# Patient Record
Sex: Female | Born: 1975 | Race: White | Hispanic: No | Marital: Married | Smoking: Current every day smoker
Health system: Southern US, Community
[De-identification: ages and names within clinical notes are randomized; demographics above are authoritative.]

## PROBLEM LIST (undated history)

## (undated) DIAGNOSIS — L409 Psoriasis, unspecified: Secondary | ICD-10-CM

## (undated) DIAGNOSIS — M419 Scoliosis, unspecified: Secondary | ICD-10-CM

## (undated) HISTORY — DX: Psoriasis, unspecified: L40.9

## (undated) HISTORY — PX: TUBAL LIGATION: SHX77

## (undated) HISTORY — DX: Scoliosis, unspecified: M41.9

## (undated) HISTORY — PX: SHOULDER SURGERY: SHX246

---

## 1999-03-14 ENCOUNTER — Other Ambulatory Visit: Admission: RE | Admit: 1999-03-14 | Discharge: 1999-03-14 | Payer: Self-pay | Admitting: Family Medicine

## 1999-05-09 ENCOUNTER — Encounter: Payer: Self-pay | Admitting: Family Medicine

## 1999-05-09 ENCOUNTER — Ambulatory Visit (HOSPITAL_COMMUNITY): Admission: RE | Admit: 1999-05-09 | Discharge: 1999-05-09 | Payer: Self-pay | Admitting: Family Medicine

## 1999-09-06 ENCOUNTER — Encounter: Payer: Self-pay | Admitting: Family Medicine

## 1999-09-06 ENCOUNTER — Ambulatory Visit (HOSPITAL_COMMUNITY): Admission: RE | Admit: 1999-09-06 | Discharge: 1999-09-06 | Payer: Self-pay | Admitting: Family Medicine

## 1999-09-16 ENCOUNTER — Inpatient Hospital Stay (HOSPITAL_COMMUNITY): Admission: AD | Admit: 1999-09-16 | Discharge: 1999-09-19 | Payer: Self-pay | Admitting: Family Medicine

## 2000-06-24 ENCOUNTER — Encounter: Payer: Self-pay | Admitting: Family Medicine

## 2000-06-24 ENCOUNTER — Ambulatory Visit (HOSPITAL_COMMUNITY): Admission: RE | Admit: 2000-06-24 | Discharge: 2000-06-24 | Payer: Self-pay | Admitting: Family Medicine

## 2000-08-04 ENCOUNTER — Other Ambulatory Visit: Admission: RE | Admit: 2000-08-04 | Discharge: 2000-08-04 | Payer: Self-pay | Admitting: Unknown Physician Specialty

## 2000-08-04 ENCOUNTER — Other Ambulatory Visit: Admission: RE | Admit: 2000-08-04 | Discharge: 2000-08-04 | Payer: Self-pay | Admitting: Family Medicine

## 2000-09-01 ENCOUNTER — Encounter: Payer: Self-pay | Admitting: Family Medicine

## 2000-09-01 ENCOUNTER — Ambulatory Visit (HOSPITAL_COMMUNITY): Admission: RE | Admit: 2000-09-01 | Discharge: 2000-09-01 | Payer: Self-pay | Admitting: Family Medicine

## 2000-11-19 ENCOUNTER — Encounter: Payer: Self-pay | Admitting: Family Medicine

## 2000-11-19 ENCOUNTER — Ambulatory Visit (HOSPITAL_COMMUNITY): Admission: RE | Admit: 2000-11-19 | Discharge: 2000-11-19 | Payer: Self-pay | Admitting: Family Medicine

## 2001-01-04 ENCOUNTER — Inpatient Hospital Stay (HOSPITAL_COMMUNITY): Admission: AD | Admit: 2001-01-04 | Discharge: 2001-01-04 | Payer: Self-pay | Admitting: Family Medicine

## 2001-01-14 ENCOUNTER — Ambulatory Visit (HOSPITAL_COMMUNITY): Admission: RE | Admit: 2001-01-14 | Discharge: 2001-01-14 | Payer: Self-pay | Admitting: Family Medicine

## 2001-01-14 ENCOUNTER — Encounter: Payer: Self-pay | Admitting: Family Medicine

## 2001-01-23 ENCOUNTER — Inpatient Hospital Stay (HOSPITAL_COMMUNITY): Admission: AD | Admit: 2001-01-23 | Discharge: 2001-01-27 | Payer: Self-pay | Admitting: Family Medicine

## 2001-01-23 ENCOUNTER — Encounter (INDEPENDENT_AMBULATORY_CARE_PROVIDER_SITE_OTHER): Payer: Self-pay | Admitting: Specialist

## 2001-10-21 ENCOUNTER — Other Ambulatory Visit: Admission: RE | Admit: 2001-10-21 | Discharge: 2001-10-21 | Payer: Self-pay | Admitting: Family Medicine

## 2003-03-23 ENCOUNTER — Other Ambulatory Visit: Admission: RE | Admit: 2003-03-23 | Discharge: 2003-03-23 | Payer: Self-pay | Admitting: Family Medicine

## 2004-09-13 ENCOUNTER — Emergency Department (HOSPITAL_COMMUNITY): Admission: EM | Admit: 2004-09-13 | Discharge: 2004-09-13 | Payer: Self-pay | Admitting: *Deleted

## 2005-04-22 ENCOUNTER — Ambulatory Visit: Payer: Self-pay | Admitting: Family Medicine

## 2005-08-26 ENCOUNTER — Ambulatory Visit: Payer: Self-pay | Admitting: Family Medicine

## 2005-12-01 ENCOUNTER — Ambulatory Visit: Payer: Self-pay | Admitting: Family Medicine

## 2006-09-28 ENCOUNTER — Ambulatory Visit: Payer: Self-pay | Admitting: Family Medicine

## 2007-01-04 ENCOUNTER — Encounter (INDEPENDENT_AMBULATORY_CARE_PROVIDER_SITE_OTHER): Payer: Self-pay | Admitting: General Surgery

## 2007-01-04 ENCOUNTER — Ambulatory Visit (HOSPITAL_BASED_OUTPATIENT_CLINIC_OR_DEPARTMENT_OTHER): Admission: RE | Admit: 2007-01-04 | Discharge: 2007-01-04 | Payer: Self-pay | Admitting: General Surgery

## 2010-12-10 NOTE — Op Note (Signed)
[name], Carol Nguyen                 ACCOUNT NO.:  [id number]   MEDICAL RECORD NO.:  [id number]          PATIENT TYPE:  AMB   LOCATION:  DSC                          FACILITY:  [facility]   PHYSICIAN:  Carol Nguyen, M.D.DATE OF BIRTH:  August 20, 1975   DATE OF PROCEDURE:  01/04/2007  DATE OF DISCHARGE:                               OPERATIVE REPORT   PREOPERATIVE DIAGNOSIS:  Mass left back.   POSTOPERATIVE DIAGNOSIS:  Mass left back.   PROCEDURE:  Excision mass left back.   SURGEON:  Violeta Gelinas, M.D..   ANESTHESIA:  General.   HISTORY OF PRESENT ILLNESS:  [name] is a 35 year old female who was  initially seen by Gita Kudo, M.D. at our practice for a mass on  her left back.  She declined surgery initially.  The mass has grown and  become more symptomatic.  I evaluated her in the office and she presents  for elective excision.  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   PROCEDURE IN DETAIL:  Informed consent was obtained the patient received  intravenous antibiotics.  Her site was marked.  She is brought to the  operating room.  General anesthesia with laryngeal mask airway was  administered by the anesthesia staff.  She was positioned on the right  lateral position and her back was prepped and draped in sterile fashion.  A mixture of 1% lidocaine with epinephrine and 0.25% Marcaine was  injected over the mass.  An incision was made along tissue lines.  Subcutaneous tissues were dissected down revealing this lobulated mass.  It was about 7 or 8 cm in size.  It was circumferentially dissected and  extended down to the fascia overlying her musculature.  Meticulous  hemostasis was obtained along the way with Bovie cautery.  The mass was  sent to pathology.  The wound was copiously irrigated, hemostasis was  again ensured.  Wound was then closed in layers with subcutaneous  tissues approximated with interrupted 3-0 Vicryl suture.  The skin  closed with running 4-0 Monocryl subcuticular  stitch.  Sponge, needle  and instrument counts were correct.  Benzoin, Steri-Strips and sterile  dressings were applied.  The patient tolerated procedure well without  apparent complication was taken recovery room in stable condition.      Carol Dare Janee Nguyen, M.D.  Electronically Signed     BET/MEDQ  D:  01/04/2007  T:  01/04/2007  Job:  [id number]   cc:   Dr. Dalbert Garnet, [facility]

## 2010-12-13 NOTE — Discharge Summary (Signed)
[facility]  Patient:    Carol Nguyen, Carol Nguyen Visit Number: [id number] MRN: [id number]          Service Type: OBS Location: [id number] Attending Physician:  Bosie Clos Admit Date:  01/23/2001 Discharge Date: 01/27/2001                             Discharge Summary  DATE OF BIRTH:  10-Apr-1976  ADMITTING DIAGNOSES: 1. Gravida 2, para 1 at 38-5/7 weeks, spontaneous rupture of membranes. 2. Prodromal labor. 3. Fetal macrosomia with borderline gestational diabetes.  DISCHARGE DIAGNOSES: 1. Gravida 2, para 2, term pregnancy delivered at 38-5/7 weeks. 2. Prolapsed cord. 3. Mild iron deficiency anemia.  PROCEDURES:  Primary low transverse cesarean section for prolapsed cord.  DISCHARGE INSTRUCTIONS:  Routine postop instructions were given.  Patient was to follow up on Friday, January 29, 2001, for removal of remaining staples and recheck.  DISCHARGE MEDICATIONS: 1. Tylox one to two q.6h. p.r.n. 2. Motrin 800 mg q.8h. p.r.n. 3. Ortho Tri-Cyclen to begin on Sunday, July 14th, and then IUD at six to    eight weeks postpartum. 4. Prenatal vitamin q.d.  HOSPITAL COURSE:  Twenty-three-YOA G2, P1 at 38-5/7 weeks presented with spontaneous rupture of membranes and clear fluid at approximately 2100 on January 22, 2001.  She did not experience any actual contractions but had a fair amount of low back pain.  She was started on Pitocin after cervical exam revealed a dilation of 2 cm, 50% effaced, with very high station.  Once the baby was in the pelvis, plans to complete rupture of membranes were made.  On January 24, 2001 at 7 a.m., vaginal exam revealed 6-cm cervical dilation but persistent high station.  AROM released a large quantity of fluid, babys vertex shifted to the right in the pelvis with an ear palpable end loop of cord dropped next to that ear.  It was felt that the vertex was unlikely to descend safely, so she was taken to the OR for an emergent  primary low transverse cesarean section.  At no time were fetal heart tones lost and cord pH at the time of delivery was 7.24, Apgars were 9/9 and she had a viable female with a weight of 8 pounds 10 ounces.  A boggy uterus after delivery was treated with Pitocin and methergine successfully.  Patient was kept on an additional 48 hours of IV antibiotics because of the excessive cervical and lower uterine manipulation leading up to the primary low transverse cesarean section.  Postop course was actually routine without any evidence of infection or problems.  Postop hemoglobin was 11.1.  Patient was ambulating well, stooling normally, voiding normally and eating well at the time of discharge.  She was bottle feeding, planning oral contraceptives until an IUD could be placed at six to eight weeks postpartum.  At the time of discharge, patient also developed a viral URI (had started approximately 24 hours prior to presentation to the hospital).  She was treated with Sudafed and Cepacol lozenges during her hospitalization and advised to continue that at home. She was to follow up on Friday, January 29, 2001, for removal of remaining staples. Attending Physician:  Bosie Clos DD:  01/27/01 TD:  01/29/01 Job: [id number]

## 2010-12-13 NOTE — Op Note (Signed)
[facility] [place]  Patient:    Carol Nguyen, Carol Nguyen                        MRN: [id number] Proc. Date: 02/06/01 Adm. Date:  [id number] Disc. Date: [id number] Attending:  Bosie Clos                           Operative Report  PREOPERATIVE DIAGNOSES:       1. Intrauterine pregnancy at 38-5/[redacted] weeks                                  gestation.                               2. Cord prolapse.  POSTOPERATIVE DIAGNOSES:      1. Intrauterine pregnancy at 38-5/[redacted] weeks                                  gestation.                               2. Cord prolapse.  OPERATION:                    Low transverse cesarean delivery.  SURGEON:                      Conni Elliot, M.D.  ASSISTANT:                    Montey Hora, M.D.  ANESTHESIA:                   General endotracheal.  OPERATIVE FINDINGS:           Female infant weighing 8 lb 10 oz.  Cord pH 7.24.  DESCRIPTION OF PROCEDURE:     The patient was brought emergently to the operating room, was prepped and draped in sterile fashion.  The patient was emergently placed on general anesthesia.  The abdomen was entered through a low transverse Pfannenstiel incision.  The incision was made through the skin and subcutaneous fascia.  A bladder flap was created.  A low transverse uterine incision was made.  The baby was delivered from the vertex presentation.  The cord was doubly clamped and cut and the baby handed to the neonatologist in attendance.  The placenta was delivered spontaneously.  The uterus, bladder flap, anterior peritoneum, fascia, subcutaneous tissue and skin were closed in locking fashion.  Estimated blood loss was about 700 cc. Sponge, needle and instrument counts were correct. DD:  02/24/01 TD:  02/24/01 Job: [id number]

## 2011-05-15 LAB — POCT HEMOGLOBIN-HEMACUE
Hemoglobin: 13.7
Operator id: 23949

## 2011-08-23 ENCOUNTER — Encounter (HOSPITAL_COMMUNITY): Payer: Self-pay

## 2011-08-23 ENCOUNTER — Emergency Department (HOSPITAL_COMMUNITY)
Admission: EM | Admit: 2011-08-23 | Discharge: 2011-08-23 | Disposition: A | Discharge status: Home / Self Care | Payer: Medicaid Other | Attending: Emergency Medicine | Admitting: Emergency Medicine

## 2011-08-23 DIAGNOSIS — F17200 Nicotine dependence, unspecified, uncomplicated: Secondary | ICD-10-CM | POA: Insufficient documentation

## 2011-08-23 DIAGNOSIS — J329 Chronic sinusitis, unspecified: Secondary | ICD-10-CM | POA: Insufficient documentation

## 2011-08-23 MED ORDER — LEVOFLOXACIN 500 MG PO TABS: 500.0000 mg | ORAL_TABLET | Freq: Every day | ORAL | Status: AC

## 2011-08-23 MED ORDER — HYDROCODONE-ACETAMINOPHEN 5-325 MG PO TABS: 1.0000 | ORAL_TABLET | ORAL | Status: AC | PRN

## 2011-08-23 NOTE — ED Provider Notes (Signed)
History     CSN: [id number]  Arrival date & time 08/23/11  1510   First MD Initiated Contact with Patient 08/23/11 1545      Chief Complaint  Patient presents with  . Migraine    Sinus pressure with stuffy nose    (Consider location/radiation/quality/duration/timing/severity/associated sxs/prior treatment) HPI Comments: Patient here with facial pain behind her eyes and to her cheeks - states she recently just got off amoxicillin which she was taking for an ear infection - states no relief in the pain - reports nasal congestion - fever without chills, denies cough, photophobia, neck stiffness, nausea or vomiting.  Patient is a 36 y.o. female presenting with migraine. The history is provided by the patient. No language interpreter was used.  Migraine This is a recurrent problem. The current episode started in the past 7 days. The problem occurs constantly. The problem has been unchanged. Associated symptoms include congestion and headaches. Pertinent negatives include no abdominal pain, arthralgias, chest pain, chills, coughing, diaphoresis, fatigue, fever, myalgias, nausea, neck pain, numbness, rash, sore throat, swollen glands, vertigo, visual change, vomiting or weakness. The symptoms are aggravated by nothing. She has tried nothing for the symptoms. The treatment provided no relief.    Past Medical History  Diagnosis Date  . Migraine     Past Surgical History  Procedure Date  . Cesarean section   . Tubal ligation     No family history on file.  History  Substance Use Topics  . Smoking status: Current Everyday Smoker -- 0.5 packs/day  . Smokeless tobacco: Not on file  . Alcohol Use: No    OB History    Grav Para Term Preterm Abortions TAB SAB Ect Mult Living                  Review of Systems  Constitutional: Negative for fever, chills, diaphoresis and fatigue.  HENT: Positive for congestion, sneezing and sinus pressure. Negative for sore throat and neck pain.     Respiratory: Negative for cough.   Cardiovascular: Negative for chest pain.  Gastrointestinal: Negative for nausea, vomiting and abdominal pain.  Musculoskeletal: Negative for myalgias and arthralgias.  Skin: Negative for rash.  Neurological: Positive for headaches. Negative for vertigo, weakness and numbness.  All other systems reviewed and are negative.    Allergies  Sulfa antibiotics  Home Medications  No current outpatient prescriptions on file.  BP 121/72  Pulse 88  Temp(Src) 98.3 F (36.8 C) (Oral)  Resp 20  Ht 5\' 4"  (1.626 m)  Wt 176 lb 4 oz (79.946 kg)  BMI 30.25 kg/m2  SpO2 96%  LMP 08/21/2011  Physical Exam  Nursing note and vitals reviewed. Constitutional: She is oriented to person, place, and time. She appears well-developed and well-nourished. No distress.  HENT:  Head: Normocephalic and atraumatic.  Right Ear: External ear normal.  Left Ear: External ear normal.  Nose: Mucosal edema present. Right sinus exhibits maxillary sinus tenderness and frontal sinus tenderness. Left sinus exhibits maxillary sinus tenderness and frontal sinus tenderness.  Mouth/Throat: Oropharynx is clear and moist. No oropharyngeal exudate.  Eyes: Conjunctivae are normal. Pupils are equal, round, and reactive to light. No scleral icterus.  Neck: Normal range of motion. Neck supple.  Cardiovascular: Normal rate, regular rhythm and normal heart sounds.  Exam reveals no gallop and no friction rub.   No murmur heard. Pulmonary/Chest: Effort normal and breath sounds normal. No respiratory distress. She exhibits no tenderness.  Abdominal: Soft. Bowel sounds are normal. She  exhibits no distension. There is no tenderness.  Musculoskeletal: Normal range of motion.  Lymphadenopathy:    She has no cervical adenopathy.  Neurological: She is alert and oriented to person, place, and time. No cranial nerve deficit. She exhibits normal muscle tone. Coordination normal.  Skin: Skin is warm and dry.  No rash noted. No erythema. No pallor.  Psychiatric: She has a normal mood and affect. Her behavior is normal. Judgment and thought content normal.    ED Course  Procedures (including critical care time)  Labs Reviewed - No data to display No results found.   Sinusitis   MDM  Patient here with sinusitis, though she terms this headache as "migraine", she reports no history of migraines in the past - no other classic symptoms of migraine, do not suspect ICH with this either.        Izola Price [place], [place] 08/23/11 1623

## 2011-08-23 NOTE — ED Provider Notes (Signed)
Medical screening examination/treatment/procedure(s) were performed by non-physician practitioner and as supervising physician I was immediately available for consultation/collaboration.   Andrew King, MD 08/23/11 1632 

## 2011-08-23 NOTE — ED Notes (Signed)
Pt presents with "migraine". Pt states she has had a stuffy nose and pain is in sinus area and behind right eye. Pt with Hx of Migraines. Pt states she was taking Amoxicillin approx 1 week ago for ear infection. Pt states she completed medication with no relief.

## 2011-08-23 NOTE — ED Notes (Signed)
Pt states has had headache x 1 week. Pt states has taken several OTC without relief.

## 2012-04-07 ENCOUNTER — Encounter (HOSPITAL_COMMUNITY): Payer: Self-pay

## 2012-04-07 ENCOUNTER — Emergency Department (HOSPITAL_COMMUNITY)
Admission: EM | Admit: 2012-04-07 | Discharge: 2012-04-07 | Disposition: A | Discharge status: Home / Self Care | Payer: Medicaid Other | Attending: Emergency Medicine | Admitting: Emergency Medicine

## 2012-04-07 DIAGNOSIS — J069 Acute upper respiratory infection, unspecified: Secondary | ICD-10-CM

## 2012-04-07 DIAGNOSIS — Z882 Allergy status to sulfonamides status: Secondary | ICD-10-CM | POA: Insufficient documentation

## 2012-04-07 DIAGNOSIS — J329 Chronic sinusitis, unspecified: Secondary | ICD-10-CM

## 2012-04-07 DIAGNOSIS — F17200 Nicotine dependence, unspecified, uncomplicated: Secondary | ICD-10-CM | POA: Insufficient documentation

## 2012-04-07 MED ORDER — PSEUDOEPHEDRINE HCL 60 MG PO TABS: ORAL_TABLET | ORAL | Status: DC

## 2012-04-07 MED ORDER — PREDNISONE 10 MG PO TABS: ORAL_TABLET | ORAL | Status: DC

## 2012-04-07 MED ORDER — PROMETHAZINE-DM 6.25-15 MG/5ML PO SYRP: 5.0000 mL | ORAL_SOLUTION | Freq: Four times a day (QID) | ORAL | Status: AC | PRN

## 2012-04-07 NOTE — ED Notes (Signed)
Discharge instructions reviewed with pt, questions answered. Pt verbalized understanding.  

## 2012-04-07 NOTE — ED Notes (Signed)
Pt c/o headache, cough and congestion for 3 days.

## 2012-04-07 NOTE — ED Provider Notes (Signed)
History     CSN: [id number]  Arrival date & time 04/07/12  1652   First MD Initiated Contact with Patient 04/07/12 1726      Chief Complaint  Patient presents with  . Headache  . Nasal Congestion  . Cough    (Consider location/radiation/quality/duration/timing/severity/associated sxs/prior treatment) Patient is a 36 y.o. female presenting with headaches and cough. The history is provided by the patient.  Headache  This is a new problem. The current episode started more than 2 days ago. The problem has not changed since onset.Associated with: cough and nasal congestion. The pain is located in the frontal region. The quality of the pain is described as sharp and throbbing. The pain is moderate. Pertinent negatives include no fever, no palpitations, no syncope, no shortness of breath, no nausea and no vomiting. She has tried acetaminophen for the symptoms. The treatment provided no relief.  Cough Associated symptoms include headaches. Pertinent negatives include no chest pain, no shortness of breath and no wheezing.    Past Medical History  Diagnosis Date  . Migraine     Past Surgical History  Procedure Date  . Cesarean section   . Tubal ligation     History reviewed. No pertinent family history.  History  Substance Use Topics  . Smoking status: Current Every Day Smoker -- 0.5 packs/day  . Smokeless tobacco: Not on file  . Alcohol Use: No    OB History    Grav Para Term Preterm Abortions TAB SAB Ect Mult Living                  Review of Systems  Constitutional: Negative for fever and activity change.       All ROS Neg except as noted in HPI  HENT: Positive for congestion. Negative for nosebleeds and neck pain.   Eyes: Negative for photophobia and discharge.  Respiratory: Positive for cough. Negative for shortness of breath and wheezing.   Cardiovascular: Negative for chest pain, palpitations and syncope.  Gastrointestinal: Negative for nausea, vomiting,  abdominal pain and blood in stool.  Genitourinary: Negative for dysuria, frequency and hematuria.  Musculoskeletal: Negative for back pain and arthralgias.  Skin: Negative.   Neurological: Positive for headaches. Negative for dizziness, seizures and speech difficulty.  Psychiatric/Behavioral: Negative for hallucinations and confusion.    Allergies  Sulfa antibiotics  Home Medications   Current Outpatient Rx  Name Route Sig Dispense Refill  . PREDNISONE 10 MG PO TABS  5,4,3,2,1 - take with food 15 tablet 0  . PROMETHAZINE-DM 6.25-15 MG/5ML PO SYRP Oral Take 5 mLs by mouth every 6 (six) hours as needed for cough. 120 mL 0  . PSEUDOEPHEDRINE HCL 60 MG PO TABS  1 po tid for congestion 21 tablet 0    BP 130/70  Pulse 69  Temp 97.8 F (36.6 C) (Oral)  Resp 20  Ht 5\' 5"  (1.651 m)  Wt 176 lb 6.4 oz (80.015 kg)  BMI 29.35 kg/m2  SpO2 100%  LMP 03/29/2012  Physical Exam  Nursing note and vitals reviewed. Constitutional: She is oriented to person, place, and time. She appears well-developed and well-nourished.  Non-toxic appearance.  HENT:  Head: Normocephalic.  Right Ear: Tympanic membrane and external ear normal.  Left Ear: Tympanic membrane and external ear normal.       Nasal congestion present  Eyes: EOM and lids are normal. Pupils are equal, round, and reactive to light.  Neck: Normal range of motion. Neck supple. Carotid bruit is not  present.  Cardiovascular: Normal rate, regular rhythm, normal heart sounds, intact distal pulses and normal pulses.   Pulmonary/Chest: Breath sounds normal. No respiratory distress.  Abdominal: Soft. Bowel sounds are normal. There is no tenderness. There is no guarding.  Musculoskeletal: Normal range of motion.  Lymphadenopathy:       Head (right side): No submandibular adenopathy present.       Head (left side): No submandibular adenopathy present.    She has no cervical adenopathy.  Neurological: She is alert and oriented to person, place,  and time. She has normal strength. No cranial nerve deficit or sensory deficit. She exhibits normal muscle tone. Coordination normal.  Skin: Skin is warm and dry.  Psychiatric: She has a normal mood and affect. Her speech is normal.    ED Course  Procedures (including critical care time)  Labs Reviewed - No data to display No results found.   1. Sinusitis   2. URI (upper respiratory infection)       MDM  I have reviewed nursing notes, vital signs, and all appropriate lab and imaging results for this patient. Exam is consistent with mild sinusitis and upper respiratory infection. Patient history reviewed with Sudafed 3 times daily, promethazine DM cough medication every 6 hours, and prednisone daily. Patient advised to wash hands frequently and increase fluids.       Kathie Dike, [place] 04/07/12 1749

## 2012-04-08 NOTE — ED Provider Notes (Signed)
Medical screening examination/treatment/procedure(s) were performed by non-physician practitioner and as supervising physician I was immediately available for consultation/collaboration.   Dione Booze, MD 04/08/12 [phone]

## 2012-07-21 ENCOUNTER — Encounter (HOSPITAL_COMMUNITY): Payer: Self-pay | Admitting: Emergency Medicine

## 2012-07-21 ENCOUNTER — Emergency Department (HOSPITAL_COMMUNITY)
Admission: EM | Admit: 2012-07-21 | Discharge: 2012-07-21 | Disposition: A | Discharge status: Home / Self Care | Payer: Medicaid Other | Attending: Emergency Medicine | Admitting: Emergency Medicine

## 2012-07-21 DIAGNOSIS — H6693 Otitis media, unspecified, bilateral: Secondary | ICD-10-CM

## 2012-07-21 DIAGNOSIS — F17200 Nicotine dependence, unspecified, uncomplicated: Secondary | ICD-10-CM | POA: Insufficient documentation

## 2012-07-21 DIAGNOSIS — Z8679 Personal history of other diseases of the circulatory system: Secondary | ICD-10-CM | POA: Insufficient documentation

## 2012-07-21 DIAGNOSIS — H6690 Otitis media, unspecified, unspecified ear: Secondary | ICD-10-CM | POA: Insufficient documentation

## 2012-07-21 MED ORDER — HYDROCODONE-ACETAMINOPHEN 5-325 MG PO TABS
1.0000 | ORAL_TABLET | Freq: Once | ORAL | Status: AC
  Administered 2012-07-21: 1 via ORAL
  Filled 2012-07-21: qty 1

## 2012-07-21 MED ORDER — HYDROCODONE-ACETAMINOPHEN 5-325 MG PO TABS: ORAL_TABLET | ORAL | Status: DC

## 2012-07-21 MED ORDER — ANTIPYRINE-BENZOCAINE 5.4-1.4 % OT SOLN
3.0000 [drp] | Freq: Once | OTIC | Status: AC
  Administered 2012-07-21: 3 [drp] via OTIC
  Filled 2012-07-21: qty 10

## 2012-07-21 MED ORDER — AMOXICILLIN 500 MG PO CAPS: 500.0000 mg | ORAL_CAPSULE | Freq: Three times a day (TID) | ORAL | Status: DC

## 2012-07-21 MED ORDER — AMOXICILLIN 250 MG PO CAPS
500.0000 mg | ORAL_CAPSULE | Freq: Once | ORAL | Status: AC
  Administered 2012-07-21: 500 mg via ORAL
  Filled 2012-07-21: qty 2

## 2012-07-21 NOTE — ED Provider Notes (Signed)
History     CSN: [id number]  Arrival date & time 07/21/12  2148   First MD Initiated Contact with Patient 07/21/12 2214      Chief Complaint  Patient presents with  . Otalgia    (Consider location/radiation/quality/duration/timing/severity/associated sxs/prior treatment) Patient is a 36 y.o. female presenting with ear pain. The history is provided by the patient.  Otalgia This is a new problem. The current episode started yesterday. There is pain in the right ear. The problem occurs constantly. The problem has been gradually worsening. There has been no fever. The pain is moderate. Associated symptoms include headaches and rhinorrhea. Pertinent negatives include no ear discharge, no hearing loss, no sore throat, no abdominal pain, no vomiting, no neck pain, no cough and no rash.    Past Medical History  Diagnosis Date  . Migraine     Past Surgical History  Procedure Date  . Cesarean section   . Tubal ligation     History reviewed. No pertinent family history.  History  Substance Use Topics  . Smoking status: Current Every Day Smoker -- 0.5 packs/day  . Smokeless tobacco: Not on file  . Alcohol Use: No    OB History    Grav Para Term Preterm Abortions TAB SAB Ect Mult Living                  Review of Systems  Constitutional: Negative for fever, activity change and appetite change.  HENT: Positive for ear pain, congestion, rhinorrhea and sinus pressure. Negative for hearing loss, nosebleeds, sore throat, facial swelling, trouble swallowing, neck pain, tinnitus and ear discharge.   Respiratory: Negative for cough and chest tightness.   Gastrointestinal: Negative for vomiting and abdominal pain.  Genitourinary: Negative for dysuria and flank pain.  Skin: Negative for rash.  Neurological: Positive for headaches. Negative for dizziness, weakness and numbness.  Psychiatric/Behavioral: Negative for confusion and decreased concentration.  All other systems reviewed and  are negative.    Allergies  Sulfa antibiotics  Home Medications   Current Outpatient Rx  Name  Route  Sig  Dispense  Refill  . PHENYLEPHRINE-PHENIRAMINE-DM 05-17-19 MG PO PACK   Oral   Take 1 Package by mouth as needed.           BP 121/77  Pulse 72  Temp 97.8 F (36.6 C) (Oral)  Resp 16  Ht 5\' 4"  (1.626 m)  Wt 160 lb (72.576 kg)  BMI 27.46 kg/m2  SpO2 100%  LMP 07/20/2012  Physical Exam  Nursing note and vitals reviewed. Constitutional: She is oriented to person, place, and time. She appears well-developed and well-nourished. No distress.  HENT:  Head: Normocephalic and atraumatic.  Right Ear: No swelling. No mastoid tenderness. Tympanic membrane is erythematous and bulging. No hemotympanum.  Left Ear: No swelling. No mastoid tenderness. Tympanic membrane is erythematous. Tympanic membrane is not bulging. No hemotympanum.  Nose: Rhinorrhea present.  Mouth/Throat: Uvula is midline, oropharynx is clear and moist and mucous membranes are normal.       Erythema of the bilateral TMs. Bulging of the right TM is present. Canals appear normal.  Eyes: Conjunctivae normal and EOM are normal. Pupils are equal, round, and reactive to light.  Neck: Normal range of motion. Neck supple.  Cardiovascular: Normal rate, regular rhythm, normal heart sounds and intact distal pulses.   No murmur heard. Pulmonary/Chest: Effort normal and breath sounds normal. No respiratory distress.  Musculoskeletal: Normal range of motion.  Lymphadenopathy:    She has  no cervical adenopathy.  Neurological: She is alert and oriented to person, place, and time. She exhibits normal muscle tone. Coordination normal.  Skin: Skin is warm.    ED Course  Procedures (including critical care time)  Labs Reviewed - No data to display No results found.      MDM    Vital signs are stable patient is nontoxic appearing. Bilateral otitis media is present. No focal neuro deficits, no meningeal  signs.   Pain improved after application of auralgan otic drops.  Patient agrees to close followup with her primary care physician  Prescribed: Amoxil Norco #10 Auralgan otic drops (dispensed from ED)   Sanjit Mcmichael L. [place], [place] 07/21/12 2257

## 2012-07-21 NOTE — ED Notes (Signed)
Patient complaining of right ear pain since yesterday. 

## 2012-07-23 NOTE — ED Provider Notes (Signed)
Medical screening examination/treatment/procedure(s) were performed by non-physician practitioner and as supervising physician I was immediately available for consultation/collaboration.  Lorenia Hoston, MD 07/23/12 2353 

## 2019-10-18 ENCOUNTER — Telehealth: Payer: Self-pay

## 2019-11-02 ENCOUNTER — Encounter: Payer: Self-pay | Admitting: Physician Assistant

## 2019-11-02 ENCOUNTER — Other Ambulatory Visit (HOSPITAL_COMMUNITY)
Admission: RE | Admit: 2019-11-02 | Discharge: 2019-11-02 | Disposition: A | Discharge status: Home / Self Care | Payer: Self-pay | Source: Ambulatory Visit | Attending: Physician Assistant | Admitting: Physician Assistant

## 2019-11-02 ENCOUNTER — Ambulatory Visit: Payer: Self-pay | Admitting: Physician Assistant

## 2019-11-02 VITALS — BP 126/76 | Temp 96.3°F | Ht 65.0 in | Wt 202.6 lb

## 2019-11-02 DIAGNOSIS — Z131 Encounter for screening for diabetes mellitus: Secondary | ICD-10-CM

## 2019-11-02 DIAGNOSIS — Z13220 Encounter for screening for lipoid disorders: Secondary | ICD-10-CM

## 2019-11-02 DIAGNOSIS — L853 Xerosis cutis: Secondary | ICD-10-CM

## 2019-11-02 DIAGNOSIS — Z7689 Persons encountering health services in other specified circumstances: Secondary | ICD-10-CM

## 2019-11-02 DIAGNOSIS — Z1239 Encounter for other screening for malignant neoplasm of breast: Secondary | ICD-10-CM

## 2019-11-02 DIAGNOSIS — Z8632 Personal history of gestational diabetes: Secondary | ICD-10-CM

## 2019-11-02 DIAGNOSIS — F17200 Nicotine dependence, unspecified, uncomplicated: Secondary | ICD-10-CM

## 2019-11-02 LAB — LIPID PANEL
Cholesterol: 176 mg/dL (ref 0–200)
HDL: 43 mg/dL (ref >40)
LDL Cholesterol: 122 mg/dL — ABNORMAL HIGH (ref 0–99)
Total CHOL/HDL Ratio: 4.1 RATIO
Triglycerides: 57 mg/dL (ref <150)
VLDL: 11 mg/dL (ref 0–40)

## 2019-11-02 LAB — COMPREHENSIVE METABOLIC PANEL
ALT: 16 U/L (ref 0–44)
AST: 14 U/L — ABNORMAL LOW (ref 15–41)
Albumin: 4.3 g/dL (ref 3.5–5.0)
Alkaline Phosphatase: 81 U/L (ref 38–126)
Anion gap: 7 (ref 5–15)
BUN: 11 mg/dL (ref 6–20)
CO2: 27 mmol/L (ref 22–32)
Calcium: 9.1 mg/dL (ref 8.9–10.3)
Chloride: 105 mmol/L (ref 98–111)
Creatinine, Ser: 0.62 mg/dL (ref 0.44–1.00)
GFR calc Af Amer: >60 mL/min (ref >60)
GFR calc non Af Amer: >60 mL/min (ref >60)
Glucose, Bld: 104 mg/dL — ABNORMAL HIGH (ref 70–99)
Potassium: 3.7 mmol/L (ref 3.5–5.1)
Sodium: 139 mmol/L (ref 135–145)
Total Bilirubin: 0.5 mg/dL (ref 0.3–1.2)
Total Protein: 6.8 g/dL (ref 6.5–8.1)

## 2019-11-02 MED ORDER — TRIAMCINOLONE ACETONIDE 0.1 % EX OINT: 1.0000 "application " | TOPICAL_OINTMENT | Freq: Two times a day (BID) | CUTANEOUS | 1 refills | Status: DC | PRN

## 2019-11-02 NOTE — Progress Notes (Signed)
BP 126/76   Temp (!) 96.3 F (35.7 C)   Ht 5\' 5"  (1.651 m)   Wt 202 lb 9.6 oz (91.9 kg)   BMI 33.71 kg/m    Subjective:    Patient ID: Carol Nguyen, female    DOB: [birth year], 44 y.o.   MRN: [id number]  HPI: Carol Nguyen is a 44 y.o. female presenting on 11/02/2019 for No chief complaint on file.   HPI   Pt had a negative covid 19 screening questionnaire.    Pt is 61yoF who presents to office today to establish care.  She says she was previously a pt at [facility] but says they only treated her psoriasis.  Pt recently moved to [facility].  No O2 sat done today due to nail polish  Pt works at a [employer]-  She Does all jobs at [employer].    She says her Only condition is psoriasis  Pt never had mammogram.  She says her Mother had breast cancer  Pt thinks she has hasn't had a PAP in probably 11 years ago.    Pt thinks her only labs were to see what kind of psoriasis she had.  Pt only complaint is psoriasis.  She says it is Worse lately.  Pt thinks due to stress.  moslty on the legs .      Relevant past medical, surgical, family and social history reviewed and updated as indicated. Interim medical history since our last visit reviewed. Allergies and medications reviewed and updated.   Current Outpatient Medications:  .  triamcinolone ointment (KENALOG) 0.1 %, Apply 1 application topically 2 (two) times daily., Disp: , Rfl:    Review of Systems  Per HPI unless specifically indicated above     Objective:    BP 126/76   Temp (!) 96.3 F (35.7 C)   Ht 5\' 5"  (1.651 m)   Wt 202 lb 9.6 oz (91.9 kg)   BMI 33.71 kg/m   Wt Readings from Last 3 Encounters:  11/02/19 202 lb 9.6 oz (91.9 kg)  07/21/12 160 lb (72.6 kg)  04/07/12 176 lb 6.4 oz (80 kg)    Physical Exam Vitals reviewed.  Constitutional:      General: She is not in acute distress.    Appearance: Normal appearance. She is well-developed. She is not  ill-appearing.  HENT:     Head: Normocephalic and atraumatic.     Mouth/Throat:     Pharynx: No oropharyngeal exudate.  Eyes:     Conjunctiva/sclera: Conjunctivae normal.     Pupils: Pupils are equal, round, and reactive to light.  Neck:     Thyroid: No thyromegaly.  Cardiovascular:     Rate and Rhythm: Normal rate and regular rhythm.  Pulmonary:     Effort: Pulmonary effort is normal.     Breath sounds: Normal breath sounds.  Abdominal:     General: Bowel sounds are normal.     Palpations: Abdomen is soft. There is no mass.     Tenderness: There is no abdominal tenderness.  Musculoskeletal:     Cervical back: Neck supple.     Right lower leg: No edema.     Left lower leg: No edema.  Lymphadenopathy:     Cervical: No cervical adenopathy.  Skin:    General: Skin is warm and dry.          Comments: No plaques seen.  On place on L thigh about 1 inch in  diameter shows dry skin with no discoloration.  Neurological:     Mental Status: She is alert and oriented to person, place, and time.     Gait: Gait normal.  Psychiatric:        Attention and Perception: Attention normal.        Mood and Affect: Mood normal.        Speech: Speech normal.        Behavior: Behavior normal. Behavior is cooperative.         Assessment & Plan:   Encounter Diagnoses  Name Primary?  . Encounter to establish care Yes  . Dry skin   . Tobacco use disorder   . Screening cholesterol level   . Screening for diabetes mellitus   . History of gestational diabetes   . Encounter for screening for malignant neoplasm of breast, unspecified screening modality      -discussed with pt that skin condition is controlled and she doesn't need anything stronger than the TAC she is currently using.  She is given refill.  She is told that she can call for appointment if she has a flare-up.  Discussed with pt not sure it is psoriasis.  Request sent for labs done 'to determine what kind of psoriasis she has'  sent to [facility] -will refer for Screening mammogram -will get Baseline labs.  She will be called with results -pt already has appt to get covid vaccination -pt will follow up in 6 months and will plan to update PAP at that time.  She is to contact office sooner prn

## 2019-11-03 LAB — HEMOGLOBIN A1C
Hgb A1c MFr Bld: 5.3 % (ref 4.8–5.6)
Mean Plasma Glucose: 105 mg/dL

## 2019-12-01 ENCOUNTER — Other Ambulatory Visit: Payer: Self-pay | Admitting: Student

## 2019-12-01 DIAGNOSIS — Z1231 Encounter for screening mammogram for malignant neoplasm of breast: Secondary | ICD-10-CM

## 2019-12-08 ENCOUNTER — Ambulatory Visit (HOSPITAL_COMMUNITY): Payer: Self-pay

## 2019-12-21 ENCOUNTER — Ambulatory Visit (HOSPITAL_COMMUNITY): Payer: Self-pay

## 2019-12-29 ENCOUNTER — Encounter: Payer: Self-pay | Admitting: Physician Assistant

## 2020-02-06 ENCOUNTER — Ambulatory Visit (HOSPITAL_COMMUNITY)
Admission: RE | Admit: 2020-02-06 | Discharge: 2020-02-06 | Disposition: A | Discharge status: Home / Self Care | Payer: Self-pay | Source: Ambulatory Visit | Attending: Physician Assistant | Admitting: Physician Assistant

## 2020-02-06 ENCOUNTER — Encounter: Payer: Self-pay | Admitting: Physician Assistant

## 2020-02-06 ENCOUNTER — Other Ambulatory Visit: Payer: Self-pay

## 2020-02-06 DIAGNOSIS — Z1231 Encounter for screening mammogram for malignant neoplasm of breast: Secondary | ICD-10-CM | POA: Insufficient documentation

## 2020-02-06 NOTE — Progress Notes (Signed)
Pt called 02-06-20 stating she went to get her screening mammogram done but was unable to do it due to mentioning a lump on R breast. Pt was advised to contact [facility] to have dx mammo arranged. Pt states she noticed lump on R breast about one month ago. Pt reports no nipple drainage or swelling.   PA agrees to have pt arranged for dx mammo.

## 2020-02-09 ENCOUNTER — Other Ambulatory Visit (HOSPITAL_COMMUNITY): Payer: Self-pay | Admitting: Physician Assistant

## 2020-02-09 DIAGNOSIS — N63 Unspecified lump in unspecified breast: Secondary | ICD-10-CM

## 2020-02-28 ENCOUNTER — Other Ambulatory Visit (HOSPITAL_COMMUNITY): Payer: Self-pay

## 2020-02-28 ENCOUNTER — Ambulatory Visit (HOSPITAL_COMMUNITY)
Admission: RE | Admit: 2020-02-28 | Discharge: 2020-02-28 | Disposition: A | Discharge status: Home / Self Care | Payer: Self-pay | Source: Ambulatory Visit | Attending: Physician Assistant | Admitting: Physician Assistant

## 2020-02-28 ENCOUNTER — Other Ambulatory Visit: Payer: Self-pay

## 2020-02-28 DIAGNOSIS — N63 Unspecified lump in unspecified breast: Secondary | ICD-10-CM | POA: Insufficient documentation

## 2020-03-06 ENCOUNTER — Other Ambulatory Visit: Payer: Self-pay | Admitting: *Deleted

## 2020-03-06 DIAGNOSIS — R928 Other abnormal and inconclusive findings on diagnostic imaging of breast: Secondary | ICD-10-CM

## 2020-03-13 ENCOUNTER — Ambulatory Visit
Admission: RE | Admit: 2020-03-13 | Discharge: 2020-03-13 | Disposition: A | Discharge status: Home / Self Care | Payer: No Typology Code available for payment source | Source: Ambulatory Visit | Attending: *Deleted | Admitting: *Deleted

## 2020-03-13 ENCOUNTER — Other Ambulatory Visit: Payer: Self-pay

## 2020-03-13 DIAGNOSIS — R928 Other abnormal and inconclusive findings on diagnostic imaging of breast: Secondary | ICD-10-CM

## 2020-03-13 HISTORY — PX: BREAST BIOPSY: SHX20

## 2020-04-30 ENCOUNTER — Ambulatory Visit: Payer: Self-pay | Admitting: Physician Assistant

## 2021-02-01 ENCOUNTER — Other Ambulatory Visit: Payer: Self-pay | Admitting: Physician Assistant

## 2021-02-01 DIAGNOSIS — Z1231 Encounter for screening mammogram for malignant neoplasm of breast: Secondary | ICD-10-CM

## 2021-10-17 IMAGING — MG DIGITAL DIAGNOSTIC BILAT W/ TOMO W/ CAD
8 of 17 series · 8 of 40 positions shown · non-contrast
Comparison: None.

CLINICAL DATA: 44-year-old female with a palpable area of concern
in the right breast. Patient has a strong family history of breast
cancer with her mother diagnosed with breast cancer in her 60s post
mastectomy.

EXAM:
DIGITAL DIAGNOSTIC BILATERAL MAMMOGRAM WITH TOMO AND CAD; ULTRASOUND
RIGHT BREAST LIMITED

[L CC]
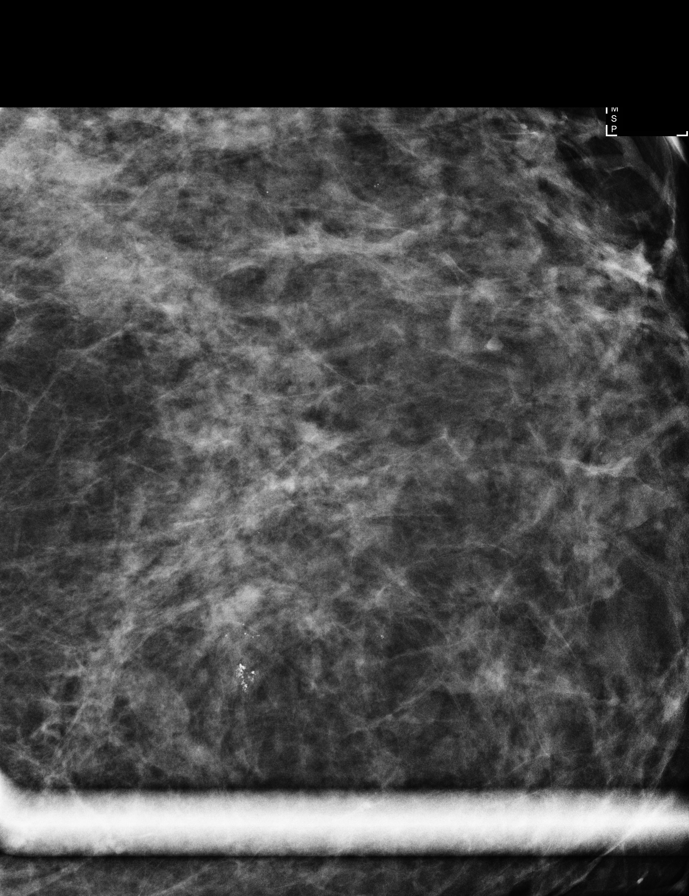

[L ML]
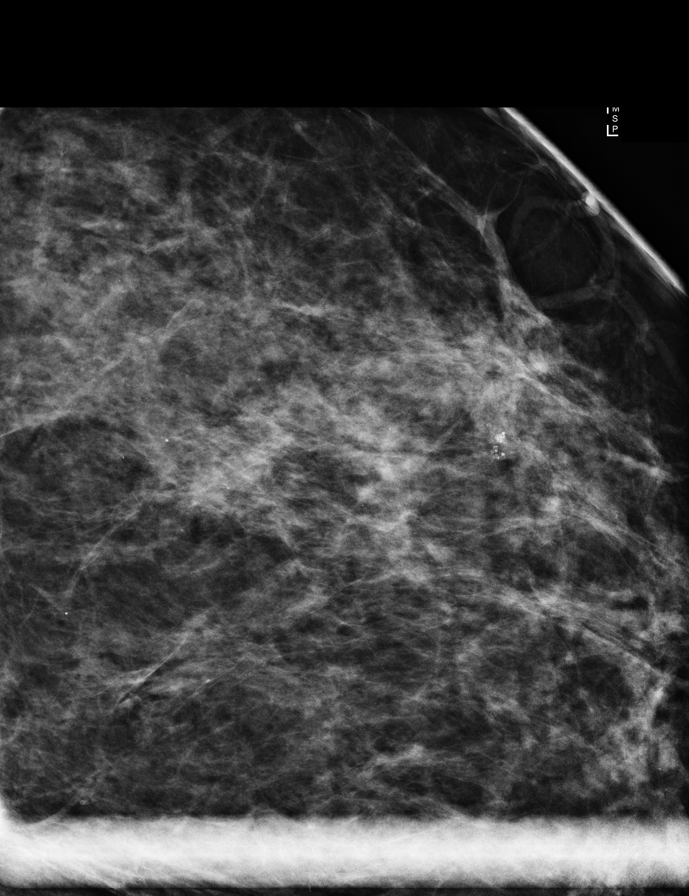

[R CC synth-2D (1 of 2)]
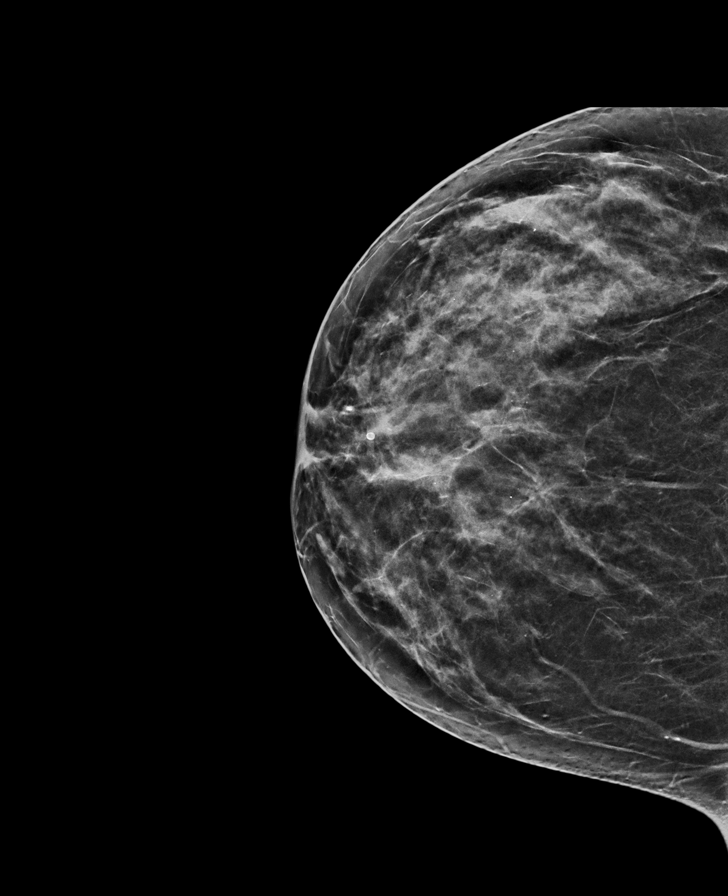

[L ML synth-2D]
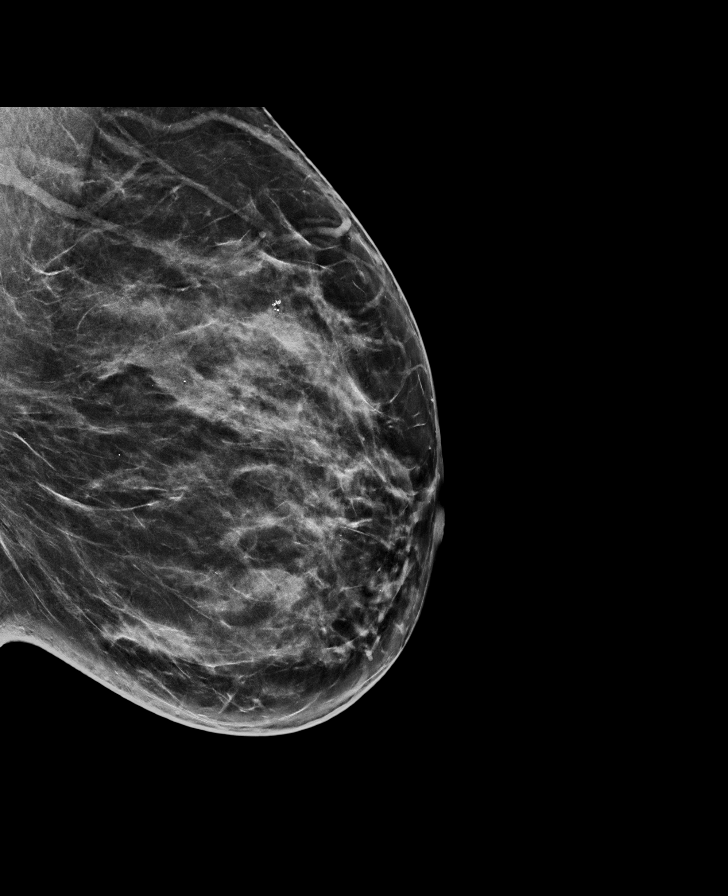

[L MLO synth-2D]
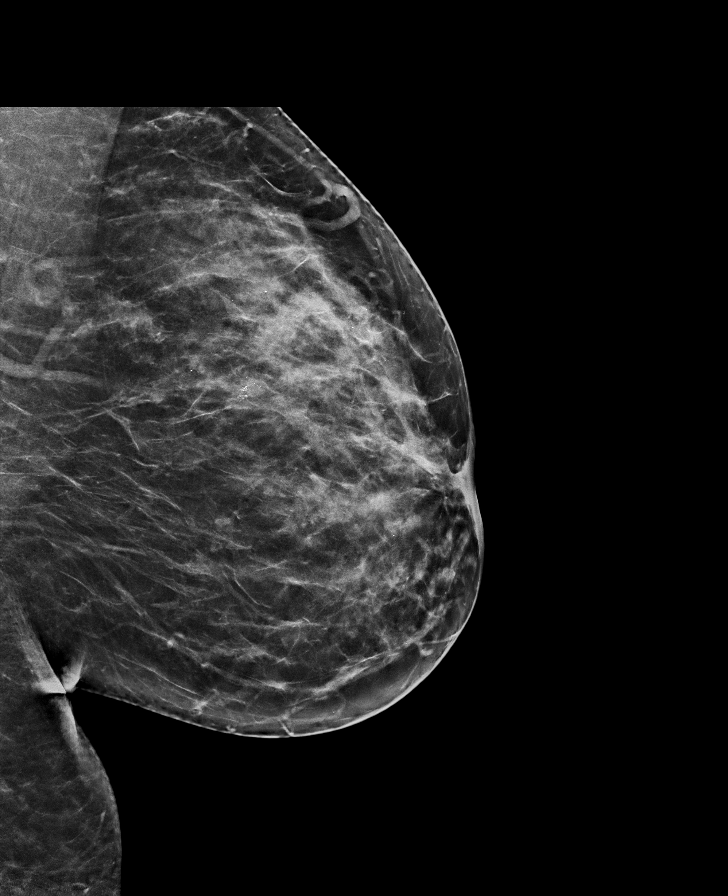

[R MLO synth-2D]
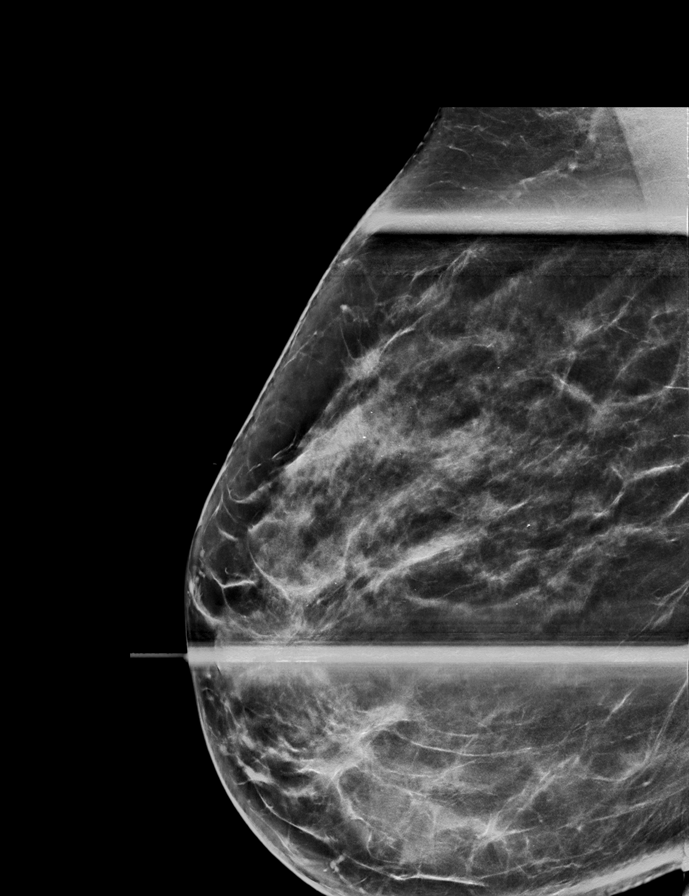

[R CC synth-2D (2 of 2)]
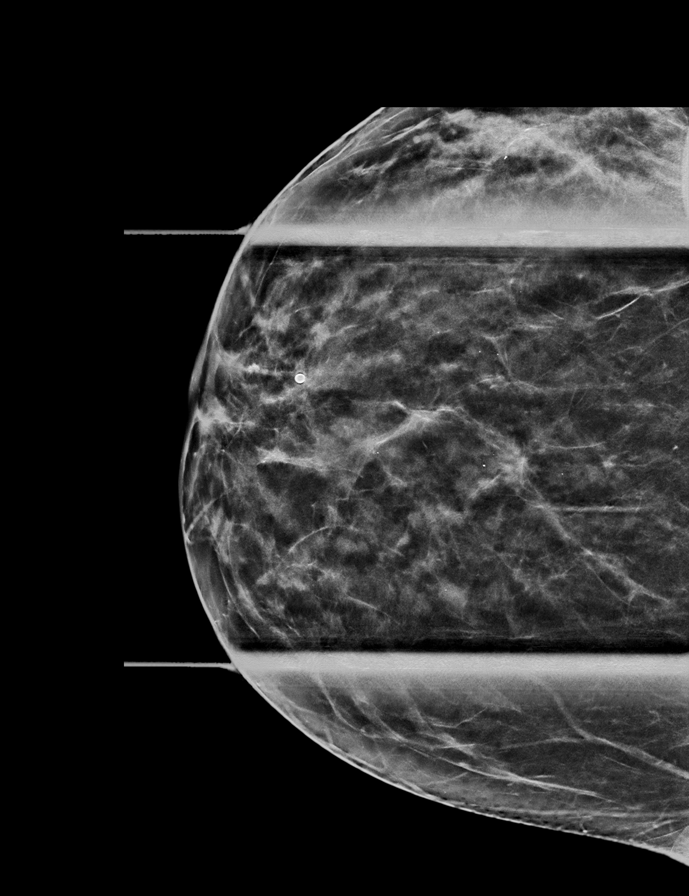

[L CC synth-2D]
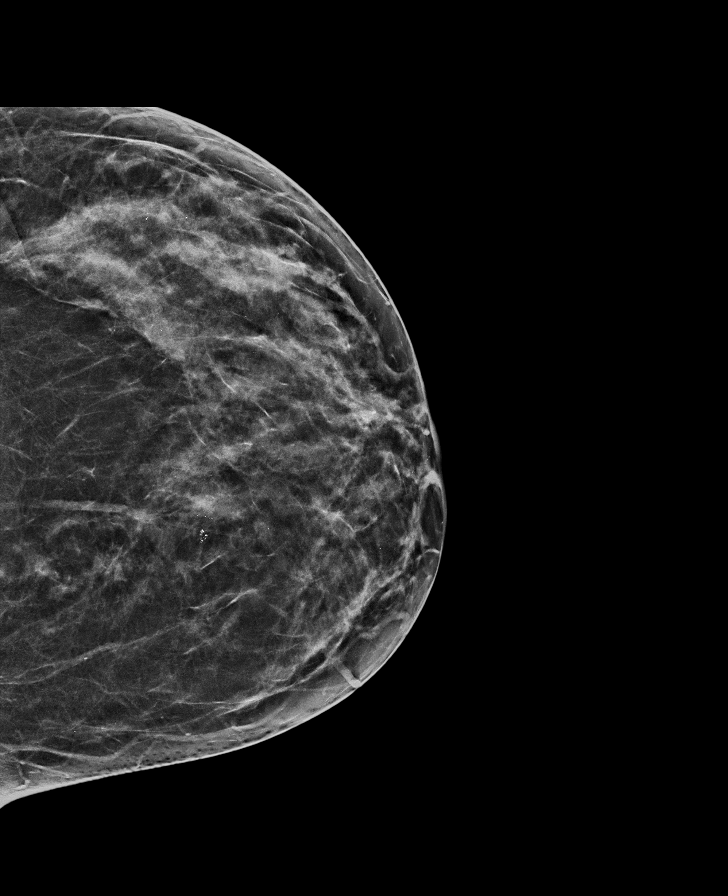

[8 of 40 positions shown; findings below may reference images not displayed]

ACR Breast Density Category c: The breast tissue is heterogeneously
dense, which may obscure small masses.
FINDINGS: No suspicious masses or calcifications are seen in the right breast.
An initially questioned asymmetry seen in the inner right breast
resolves on the additional spot compression CC tomograms compatible
with an area of overlapping fibroglandular tissue. Spot compression
MLO tomograms were performed over the palpable area of concern in
the right breast with no definite abnormalities identified.

There are coarse heterogeneous calcifications within the upper
slightly inner left breast with adjacent round/punctate
calcifications together measuring 1 cm. These do not clearly
demonstrate layering on the spot compression ML view.

Mammographic images were processed with CAD.

Physical examination site of in the central right breast does not
reveal any palpable masses.

Targeted ultrasound of the right breast was performed. No suspicious
masses or abnormality seen, only dense fibroglandular tissue
identified at the site of palpable concern in the right breast.
IMPRESSION: 1. No mammographic or sonographic abnormalities at the site of
palpable concern in the right breast.

2. Indeterminate 1 cm group of calcifications in the upper slightly
inner left breast.

RECOMMENDATION:
Recommend stereotactic guided biopsy of the calcifications in the
left breast.

I have discussed the findings and recommendations with the patient.
If applicable, a reminder letter will be sent to the patient
regarding the next appointment.

BI-RADS CATEGORY  4: Suspicious.

## 2021-12-11 ENCOUNTER — Encounter: Payer: Self-pay | Admitting: Obstetrics & Gynecology

## 2021-12-31 ENCOUNTER — Encounter: Payer: No Typology Code available for payment source | Admitting: Obstetrics & Gynecology

## 2022-07-23 DIAGNOSIS — J0100 Acute maxillary sinusitis, unspecified: Secondary | ICD-10-CM | POA: Diagnosis not present

## 2022-07-23 DIAGNOSIS — J029 Acute pharyngitis, unspecified: Secondary | ICD-10-CM | POA: Diagnosis not present

## 2022-07-23 DIAGNOSIS — R059 Cough, unspecified: Secondary | ICD-10-CM | POA: Diagnosis not present

## 2022-08-02 DIAGNOSIS — R509 Fever, unspecified: Secondary | ICD-10-CM | POA: Diagnosis not present

## 2022-08-02 DIAGNOSIS — J00 Acute nasopharyngitis [common cold]: Secondary | ICD-10-CM | POA: Diagnosis not present

## 2022-08-02 DIAGNOSIS — M419 Scoliosis, unspecified: Secondary | ICD-10-CM | POA: Diagnosis not present

## 2022-08-02 DIAGNOSIS — M546 Pain in thoracic spine: Secondary | ICD-10-CM | POA: Diagnosis not present

## 2022-08-02 DIAGNOSIS — R051 Acute cough: Secondary | ICD-10-CM | POA: Diagnosis not present

## 2022-08-02 DIAGNOSIS — Z5321 Procedure and treatment not carried out due to patient leaving prior to being seen by health care provider: Secondary | ICD-10-CM | POA: Diagnosis not present

## 2022-08-11 ENCOUNTER — Ambulatory Visit: Payer: Medicaid Other | Admitting: Nurse Practitioner

## 2022-08-11 ENCOUNTER — Encounter: Payer: Self-pay | Admitting: Nurse Practitioner

## 2022-08-11 VITALS — BP 117/82 | HR 77 | Temp 98.6°F | Ht 63.0 in | Wt 201.0 lb

## 2022-08-11 DIAGNOSIS — L853 Xerosis cutis: Secondary | ICD-10-CM | POA: Diagnosis not present

## 2022-08-11 MED ORDER — TRIAMCINOLONE 0.1 % CREAM:EUCERIN CREAM 1:1: 1.0000 | TOPICAL_CREAM | Freq: Two times a day (BID) | CUTANEOUS | Status: DC

## 2022-08-11 MED ORDER — TRIAMCINOLONE ACETONIDE 0.5 % EX OINT: 1.0000 | TOPICAL_OINTMENT | Freq: Two times a day (BID) | CUTANEOUS | 0 refills | Status: DC

## 2022-08-11 NOTE — Assessment & Plan Note (Signed)
Patient is establishing care today with concerns of bilateral hand rash. 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 I completed referral to dermatology as her skin is getting worse. Advised patient to keep skin moisturized and avoid scratching with nails, wearing  protective gloves can help keep her skin from direct dish soap and water. Eucerin advance repair cream and Kenalog will provide healing and some relief

## 2022-08-11 NOTE — Patient Instructions (Signed)
Eczema Eczema refers to a group of skin conditions that cause skin to become rough and inflamed. Each type of eczema has different triggers, symptoms, and treatments. Eczema of any type is usually itchy. Symptoms range from mild to severe. Eczema is not spread from person to person (is not contagious). It can appear on different parts of the body at different times. One person's eczema may look different from another person's eczema. What are the causes? The exact cause of this condition is not known. However, exposure to certain environmental factors, irritants, and allergens can make the condition worse. What are the signs or symptoms? Symptoms of this condition depend on the type of eczema you have. The types include: Contact dermatitis. There are two kinds: Irritant contact dermatitis. This happens when something irritates the skin and causes a rash. Allergic contact dermatitis. This happens when your skin comes in contact with something you are allergic to (allergens). This can include poison ivy, chemicals, or medicines that were applied to your skin. Atopic dermatitis. This is a long-term (chronic) skin disease that keeps coming back (recurring). It is the most common type of eczema. Usual symptoms are a red rash and itchy, dry, scaly skin. It usually starts showing signs in infancy and can last through adulthood. Dyshidrotic eczema. This is a form of eczema on the hands and feet. It shows up as very itchy, fluid-filled blisters. It can affect people of any age but is more common before age 40. Hand eczema. This causes very itchy areas of skin on the palms and sides of the hands and fingers. This type of eczema is common in industrial jobs where you may be exposed to different types of irritants. Lichen simplex chronicus. This type of eczema occurs when a person constantly scratches one area of the body. Repeated scratching of the area leads to thickened skin (lichenification). This condition can  accompany other types of eczema. It is more common in adults but may also be seen in children. Nummular eczema. This is a common type of eczema that most often affects the lower legs and the backs of the hands. It typically causes an itchy, red, circular, crusty lesion (plaque). Scratching may become a habit and can cause bleeding. Nummular eczema occurs most often in middle-aged or older people. Seborrheic dermatitis. This is a common skin disease that mainly affects the scalp. It may also affect other oily areas of the body, such as the face, sides of the nose, eyebrows, ears, eyelids, and chest. It is marked by small scaling and redness of the skin (erythema). This can affect people of all ages. In infants, this condition is called cradle cap. Stasis dermatitis. This is a common skin disease that can cause itching, scaling, and hyperpigmentation, usually on the legs and feet. It occurs most often in people who have a condition that prevents blood from being pumped through the veins in the legs (chronic venous insufficiency). Stasis dermatitis is a chronic condition that needs long-term management. How is this diagnosed? This condition may be diagnosed based on: A physical exam of your skin. Your medical history. Skin patch tests. These tests involve using patches that contain possible allergens and placing them on your back. Your health care provider will check in a few days to see if an allergic reaction occurred. How is this treated? Treatment for eczema is based on the type of eczema you have. You may be given hydrocortisone steroid medicine or antihistamines. These can relieve itching quickly and help reduce inflammation.   These may be prescribed or purchased over the counter, depending on the strength that is needed. Follow these instructions at home: Take or apply over-the-counter and prescription medicines only as told by your health care provider. Use creams or ointments to moisturize your  skin. Do not use lotions. Learn what triggers or irritates your symptoms so you can avoid these things. Treat symptom flare-ups quickly. Do not scratch your skin. This can make your rash worse. Keep all follow-up visits. This is important. Where to find more information American Academy of Dermatology: aad.org National Eczema Association: nationaleczema.org The Society for Pediatric Dermatology: pedsderm.net Contact a health care provider if: You have severe itching, even with treatment. You scratch your skin regularly until it bleeds. Your rash looks different than usual. Your skin is painful, swollen, or more red than usual. You have a fever. Summary Eczema refers to a group of skin conditions that cause skin to become rough and inflamed. Each type has different triggers. Eczema of any type causes itching that may range from mild to severe. Treatment varies based on the type of eczema you have. Hydrocortisone steroid medicine or antihistamines can help with itching and inflammation. Protecting your skin is the best way to prevent eczema. Use creams or ointments to moisturize your skin. Avoid triggers and irritants. Treat flare-ups quickly. This information is not intended to replace advice given to you by your health care provider. Make sure you discuss any questions you have with your health care provider. Document Revised: 04/23/2020 Document Reviewed: 04/23/2020 Elsevier Patient Education  2023 Elsevier Inc.  

## 2022-08-11 NOTE — Progress Notes (Signed)
New Patient Note  RE: Carol Nguyen MRN: [id number] DOB: Jan 04, 1976 Date of Office Visit: 08/11/2022  Chief Complaint: Rash  History of Present Illness: Rash: Patient complains of rash involving the  hands . Rash started a few months ago. Appearance of rash at onset: Other appearance: red, dry and scaly. Rash has not changed over time Initial distribution:  bilateral hands .  Discomfort associated with rash: is pruritic.  Associated symptoms:  none . Denies: cough, fever, and headache. Patient has had previous evaluation of rash. Patient has had previous treatment.  Response to treatment: poor. Patient has not had contacts with similar rash. Patient has identified precipitant. 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   Assessment and Plan: [name] is a 47 y.o. female with: Dry skin dermatitis Patient is establishing care today with concerns of bilateral hand rash. 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 I completed referral to dermatology as her skin is getting worse. Advised patient to keep skin moisturized and avoid scratching with nails, wearing  protective gloves can help keep her skin from direct dish soap and water. Eucerin advance repair cream and Kenalog will provide healing and some relief  Return if symptoms worsen or fail to improve.   Diagnostics:   Past Medical History: Patient Active Problem List   Diagnosis Date Noted   Dry skin dermatitis 08/11/2022   Past Medical History:  Diagnosis Date   Migraine    Psoriasis    Scoliosis    Past Surgical History: Past Surgical History:  Procedure Laterality Date   CESAREAN SECTION     2   SHOULDER SURGERY Left    TUBAL LIGATION     Medication List:  Current Outpatient Medications  Medication Sig Dispense Refill   Triamcinolone Acetonide  (TRIAMCINOLONE 0.1 % CREAM : EUCERIN) CREA Apply 1 Application topically 2 (two) times daily.     triamcinolone ointment (KENALOG) 0.5 % Apply 1 Application topically 2 (two) times daily. 30 g 0   No current facility-administered medications for this visit.   Allergies: Allergies  Allergen Reactions   Sulfa Antibiotics Other (See Comments)    Nose bleed, hives   Social History: Social History   Socioeconomic History   Marital status: Married    Spouse name: Henrene Pastor   Number of children: 3   Years of education: Not on file   Highest education level: Not on file  Occupational History    Comment: airport  Tobacco Use   Smoking status: Every Day    Packs/day: 0.50    Types: Cigarettes   Smokeless tobacco: Never  Vaping Use   Vaping Use: Never used  Substance and Sexual Activity   Alcohol use: No   Drug use: No   Sexual activity: Yes    Birth control/protection: Surgical  Other Topics Concern   Not on file  Social History Narrative   Not on file   Social Determinants of Health   Financial Resource Strain: Not on file  Food Insecurity: Not on file  Transportation Needs: Not on file  Physical Activity: Not on file  Stress: Not on file  Social Connections: Not on file       Family History: Family History  Problem Relation Age of Onset   Cancer Mother    Breast cancer Mother    Diabetes Mother  Hypertension Mother    Stroke Father    Heart disease Father    Cancer Sister    Cancer Paternal Grandmother    Cancer Paternal Grandfather          Review of Systems  Constitutional: Negative.   HENT: Negative.    Eyes: Negative.   Respiratory: Negative.    Cardiovascular: Negative.   Gastrointestinal: Negative.   Genitourinary: Negative.   Musculoskeletal: Negative.   Skin:  Positive for rash.  All other systems reviewed and are negative.  Objective: BP 117/82   Pulse 77   Temp 98.6 F (37 C)   Ht 5\' 3"  (1.6 m)   Wt 201 lb (91.2 kg)   LMP  08/09/2022 Comment: start date  SpO2 97%   BMI 35.61 kg/m  Body mass index is 35.61 kg/m. Physical Exam Vitals and nursing note reviewed.  Constitutional:      Appearance: Normal appearance.  HENT:     Head: Normocephalic.     Right Ear: External ear normal.     Left Ear: External ear normal.     Nose: Nose normal.     Mouth/Throat:     Mouth: Mucous membranes are moist.     Pharynx: Oropharynx is clear.  Eyes:     Conjunctiva/sclera: Conjunctivae normal.  Cardiovascular:     Rate and Rhythm: Normal rate and regular rhythm.     Pulses: Normal pulses.     Heart sounds: Normal heart sounds.  Pulmonary:     Effort: Pulmonary effort is normal.     Breath sounds: Normal breath sounds.  Abdominal:     General: Bowel sounds are normal.  Skin:    General: Skin is dry.     Findings: Erythema and rash present.  Neurological:     General: No focal deficit present.     Mental Status: She is alert and oriented to person, place, and time.  Psychiatric:        Mood and Affect: Mood normal.        Behavior: Behavior normal.    The plan was reviewed with the patient/family, and all questions/concerned were addressed.  It was my pleasure to see [name] today and participate in her care. Please feel free to contact me with any questions or concerns.  Sincerely,  Jac Canavan NP [place]

## 2022-08-11 NOTE — Addendum Note (Signed)
Addended by: Ivy Lynn on: 08/11/2022 11:36 PM   Modules accepted: Orders

## 2022-09-07 DIAGNOSIS — H5213 Myopia, bilateral: Secondary | ICD-10-CM | POA: Diagnosis not present

## 2022-09-23 ENCOUNTER — Ambulatory Visit: Payer: Medicaid Other | Admitting: Family Medicine

## 2022-09-23 ENCOUNTER — Encounter: Payer: Self-pay | Admitting: Family Medicine

## 2022-09-23 VITALS — BP 120/80 | HR 82 | Temp 97.0°F | Ht 63.0 in | Wt 200.4 lb

## 2022-09-23 DIAGNOSIS — Z8742 Personal history of other diseases of the female genital tract: Secondary | ICD-10-CM

## 2022-09-23 DIAGNOSIS — J0140 Acute pansinusitis, unspecified: Secondary | ICD-10-CM

## 2022-09-23 MED ORDER — PREDNISONE 20 MG PO TABS: 40.0000 mg | ORAL_TABLET | Freq: Every day | ORAL | 0 refills | Status: AC

## 2022-09-23 MED ORDER — FLUTICASONE PROPIONATE 50 MCG/ACT NA SUSP: 2.0000 | Freq: Every day | NASAL | 6 refills | Status: DC

## 2022-09-23 MED ORDER — DOXYCYCLINE HYCLATE 100 MG PO TABS: 100.0000 mg | ORAL_TABLET | Freq: Two times a day (BID) | ORAL | 0 refills | Status: AC

## 2022-09-23 MED ORDER — FLUCONAZOLE 150 MG PO TABS: 150.0000 mg | ORAL_TABLET | Freq: Once | ORAL | 0 refills | Status: AC

## 2022-09-23 MED ORDER — CHLORPHEN-PE-ACETAMINOPHEN 4-10-325 MG PO TABS: 1.0000 | ORAL_TABLET | Freq: Four times a day (QID) | ORAL | 0 refills | Status: DC | PRN

## 2022-09-23 NOTE — Progress Notes (Signed)
Subjective:  Patient ID: Carol Nguyen, female    DOB: 26-Dec-1975, 47 y.o.   MRN: [id number]  Patient Care Team: Baruch Gouty, FNP as PCP - General (Family Medicine)   Chief Complaint:  Cough (Since got flu shot 2 mos ago, has take abx, Tamiflu, Zpak), Sore Throat, Nasal Congestion (/), and Fatigue   HPI: Carol Nguyen is a 47 y.o. female presenting on 09/23/2022 for Cough (Since got flu shot 2 mos ago, has take abx, Tamiflu, Zpak), Sore Throat, Nasal Congestion (/), and Fatigue   Pt presents today with ongoing congestion, cough, headaches, sinus pressure, ear fullness, and fatigue for the last 2 months. She was seen by UC and treated with a Z-pack. States symptoms did not resolve and seem to be worsening.  Sinusitis: Patient presents with chronic sinusitis. The patient reports URI symptoms for months. Her symptoms include nasal congestion, purulent rhinorrhea, cough, sneezing, sniffing, fevers, sore throats, mouthbreathing, frequent clearing of the throat, headaches, facial pain, puffiness of the eyes, itchy eyes.  There has not been a history of epistaxis, posttussive emesis, foul rhinorrhea, apnea during sleep, spitting/vomiting mucous. There has not been a history of chronic otitis media or pharyngotonsillitis.  Prior antibiotic therapy has included Azithromycin. Other medications have included oral decongestants.          Relevant past medical, surgical, family, and social history reviewed and updated as indicated.  Allergies and medications reviewed and updated. Data reviewed: Chart in Epic.   Past Medical History:  Diagnosis Date   Migraine    Psoriasis    Scoliosis     Past Surgical History:  Procedure Laterality Date   CESAREAN SECTION     2   SHOULDER SURGERY Left    TUBAL LIGATION      Social History   Socioeconomic History   Marital status: Married    Spouse name: Henrene Pastor   Number of children: 3   Years of education: Not on file   Highest education  level: Not on file  Occupational History    Comment: airport  Tobacco Use   Smoking status: Every Day    Packs/day: 0.50    Types: Cigarettes   Smokeless tobacco: Never  Vaping Use   Vaping Use: Never used  Substance and Sexual Activity   Alcohol use: No   Drug use: No   Sexual activity: Yes    Birth control/protection: Surgical  Other Topics Concern   Not on file  Social History Narrative   Not on file   Social Determinants of Health   Financial Resource Strain: Not on file  Food Insecurity: Not on file  Transportation Needs: Not on file  Physical Activity: Not on file  Stress: Not on file  Social Connections: Not on file  Intimate Partner Violence: Not on file    Outpatient Encounter Medications as of 09/23/2022  Medication Sig   albuterol (VENTOLIN HFA) 108 (90 Base) MCG/ACT inhaler Inhale 2 puffs into the lungs.   benzonatate (TESSALON) 100 MG capsule Take 100 mg by mouth every 6 (six) hours as needed for cough.   Chlorphen-PE-Acetaminophen 4-10-325 MG TABS Take 1 tablet by mouth every 6 (six) hours as needed.   cyclobenzaprine (FLEXERIL) 5 MG tablet Take 5 mg by mouth 3 (three) times daily as needed for muscle spasms.   doxycycline (VIBRA-TABS) 100 MG tablet Take 1 tablet (100 mg total) by mouth 2 (two) times daily for 10 days. 1 po bid   fluconazole (DIFLUCAN)  150 MG tablet Take 1 tablet (150 mg total) by mouth once for 1 dose.   fluticasone (FLONASE) 50 MCG/ACT nasal spray Place 2 sprays into both nostrils daily.   predniSONE (DELTASONE) 20 MG tablet Take 2 tablets (40 mg total) by mouth daily with breakfast for 5 days.   Triamcinolone Acetonide (TRIAMCINOLONE 0.1 % CREAM : EUCERIN) CREA Apply 1 Application topically 2 (two) times daily.   triamcinolone ointment (KENALOG) 0.5 % Apply 1 Application topically 2 (two) times daily.   No facility-administered encounter medications on file as of 09/23/2022.    Allergies  Allergen Reactions   Sulfa Antibiotics Other  (See Comments)    Nose bleed, hives    Review of Systems  Constitutional:  Positive for activity change, appetite change, chills and fatigue. Negative for diaphoresis, fever and unexpected weight change.  HENT:  Positive for congestion, ear pain, postnasal drip, rhinorrhea, sinus pressure, sinus pain, sneezing, sore throat and tinnitus. Negative for dental problem, drooling, ear discharge, facial swelling, hearing loss, mouth sores, nosebleeds, trouble swallowing and voice change.   Eyes: Negative.  Negative for photophobia and visual disturbance.  Respiratory:  Positive for cough. Negative for chest tightness and shortness of breath.   Cardiovascular:  Negative for chest pain, palpitations and leg swelling.  Gastrointestinal:  Negative for blood in stool, constipation, diarrhea, nausea and vomiting.  Endocrine: Negative.   Genitourinary:  Negative for decreased urine volume, difficulty urinating, dysuria, frequency and urgency.  Musculoskeletal:  Negative for arthralgias and myalgias.  Skin: Negative.   Allergic/Immunologic: Negative.   Neurological:  Positive for headaches. Negative for dizziness, tremors, seizures, syncope, facial asymmetry, speech difficulty, weakness, light-headedness and numbness.  Hematological: Negative.   Psychiatric/Behavioral:  Negative for confusion, hallucinations, sleep disturbance and suicidal ideas.   All other systems reviewed and are negative.       Objective:  BP 120/80   Pulse 82   Temp (!) 97 F (36.1 C) (Oral)   Ht '5\' 3"'$  (1.6 m)   Wt 200 lb 6.4 oz (90.9 kg)   SpO2 96%   BMI 35.50 kg/m    Wt Readings from Last 3 Encounters:  09/23/22 200 lb 6.4 oz (90.9 kg)  08/11/22 201 lb (91.2 kg)  11/02/19 202 lb 9.6 oz (91.9 kg)    Physical Exam Vitals and nursing note reviewed.  Constitutional:      General: She is not in acute distress.    Appearance: Normal appearance. She is well-developed and well-groomed. She is obese. She is not  ill-appearing, toxic-appearing or diaphoretic.  HENT:     Head: Normocephalic and atraumatic.     Jaw: There is normal jaw occlusion.     Right Ear: Hearing normal. A middle ear effusion is present. Tympanic membrane is not erythematous.     Left Ear: Hearing normal. A middle ear effusion is present. Tympanic membrane is not erythematous.     Nose: Congestion present.     Right Turbinates: Enlarged.     Left Turbinates: Enlarged.     Right Sinus: Maxillary sinus tenderness and frontal sinus tenderness present.     Left Sinus: Maxillary sinus tenderness and frontal sinus tenderness present.     Mouth/Throat:     Lips: Pink.     Mouth: Mucous membranes are moist.     Pharynx: Oropharynx is clear. Uvula midline. Posterior oropharyngeal erythema present. No pharyngeal swelling, oropharyngeal exudate or uvula swelling.  Eyes:     General: Lids are normal.     Extraocular  Movements: Extraocular movements intact.     Conjunctiva/sclera: Conjunctivae normal.     Pupils: Pupils are equal, round, and reactive to light.  Neck:     Thyroid: No thyroid mass, thyromegaly or thyroid tenderness.     Vascular: No carotid bruit or JVD.     Trachea: Trachea and phonation normal.  Cardiovascular:     Rate and Rhythm: Normal rate and regular rhythm.     Chest Wall: PMI is not displaced.     Pulses: Normal pulses.     Heart sounds: Normal heart sounds. No murmur heard.    No friction rub. No gallop.  Pulmonary:     Effort: Pulmonary effort is normal. No respiratory distress.     Breath sounds: Normal breath sounds. No wheezing.  Abdominal:     General: There is no abdominal bruit.     Palpations: There is no hepatomegaly or splenomegaly.  Musculoskeletal:        General: Normal range of motion.     Cervical back: Normal range of motion and neck supple.     Right lower leg: No edema.     Left lower leg: No edema.  Lymphadenopathy:     Cervical: No cervical adenopathy.  Skin:    General: Skin is  warm and dry.     Capillary Refill: Capillary refill takes less than 2 seconds.     Coloration: Skin is not cyanotic, jaundiced or pale.     Findings: No rash.  Neurological:     General: No focal deficit present.     Mental Status: She is alert and oriented to person, place, and time.     Sensory: Sensation is intact.     Motor: Motor function is intact.     Coordination: Coordination is intact.     Gait: Gait is intact.     Deep Tendon Reflexes: Reflexes are normal and symmetric.  Psychiatric:        Attention and Perception: Attention and perception normal.        Mood and Affect: Mood and affect normal.        Speech: Speech normal.        Behavior: Behavior normal. Behavior is cooperative.        Thought Content: Thought content normal.        Cognition and Memory: Cognition and memory normal.        Judgment: Judgment normal.     Results for orders placed or performed during the hospital encounter of 11/02/19  Comprehensive metabolic panel  Result Value Ref Range   Sodium 139 135 - 145 mmol/L   Potassium 3.7 3.5 - 5.1 mmol/L   Chloride 105 98 - 111 mmol/L   CO2 27 22 - 32 mmol/L   Glucose, Bld 104 (H) 70 - 99 mg/dL   BUN 11 6 - 20 mg/dL   Creatinine, Ser 0.62 0.44 - 1.00 mg/dL   Calcium 9.1 8.9 - 10.3 mg/dL   Total Protein 6.8 6.5 - 8.1 g/dL   Albumin 4.3 3.5 - 5.0 g/dL   AST 14 (L) 15 - 41 U/L   ALT 16 0 - 44 U/L   Alkaline Phosphatase 81 38 - 126 U/L   Total Bilirubin 0.5 0.3 - 1.2 mg/dL   GFR calc non Af Amer >60 >60 mL/min   GFR calc Af Amer >60 >60 mL/min   Anion gap 7 5 - 15  Lipid panel  Result Value Ref Range   Cholesterol 176 0 -  200 mg/dL   Triglycerides 57 <150 mg/dL   HDL 43 >40 mg/dL   Total CHOL/HDL Ratio 4.1 RATIO   VLDL 11 0 - 40 mg/dL   LDL Cholesterol 122 (H) 0 - 99 mg/dL  Hemoglobin A1c  Result Value Ref Range   Hgb A1c MFr Bld 5.3 4.8 - 5.6 %   Mean Plasma Glucose 105 mg/dL       Pertinent labs & imaging results that were available  during my care of the patient were reviewed by me and considered in my medical decision making.  Assessment & Plan:  Paislea was seen today for cough, sore throat, nasal congestion and fatigue.  Diagnoses and all orders for this visit:  Acute non-recurrent pansinusitis Ongoing for over 2 months. Failed symptomatic care and treatment with azithromycin. Will treat with below. Symptomatic care discussed in detail. Report new, worsening, or persistent symptoms.  -     Chlorphen-PE-Acetaminophen 4-10-325 MG TABS; Take 1 tablet by mouth every 6 (six) hours as needed. -     doxycycline (VIBRA-TABS) 100 MG tablet; Take 1 tablet (100 mg total) by mouth 2 (two) times daily for 10 days. 1 po bid -     fluticasone (FLONASE) 50 MCG/ACT nasal spray; Place 2 sprays into both nostrils daily. -     predniSONE (DELTASONE) 20 MG tablet; Take 2 tablets (40 mg total) by mouth daily with breakfast for 5 days.  History of vaginitis -     fluconazole (DIFLUCAN) 150 MG tablet; Take 1 tablet (150 mg total) by mouth once for 1 dose.     Continue all other maintenance medications.  Follow up plan: Return if symptoms worsen or fail to improve.   Continue healthy lifestyle choices, including diet (rich in fruits, vegetables, and lean proteins, and low in salt and simple carbohydrates) and exercise (at least 30 minutes of moderate physical activity daily).  Educational handout given for sinus infection  The above assessment and management plan was discussed with the patient. The patient verbalized understanding of and has agreed to the management plan. Patient is aware to call the clinic if they develop any new symptoms or if symptoms persist or worsen. Patient is aware when to return to the clinic for a follow-up visit. Patient educated on when it is appropriate to go to the emergency department.   Monia Pouch, FNP-C [facility] [phone]

## 2022-10-16 ENCOUNTER — Ambulatory Visit: Payer: Medicaid Other | Admitting: Family Medicine

## 2022-10-16 ENCOUNTER — Encounter: Payer: Self-pay | Admitting: Family Medicine

## 2022-10-16 VITALS — BP 121/74 | HR 68 | Temp 98.5°F | Ht 63.0 in | Wt 200.0 lb

## 2022-10-16 DIAGNOSIS — J0140 Acute pansinusitis, unspecified: Secondary | ICD-10-CM

## 2022-10-16 DIAGNOSIS — J302 Other seasonal allergic rhinitis: Secondary | ICD-10-CM | POA: Diagnosis not present

## 2022-10-16 MED ORDER — AMOXICILLIN-POT CLAVULANATE 875-125 MG PO TABS: 1.0000 | ORAL_TABLET | Freq: Two times a day (BID) | ORAL | 0 refills | Status: AC

## 2022-10-16 NOTE — Progress Notes (Signed)
Acute Office Visit  Subjective:     Patient ID: Carol Nguyen, female    DOB: 12/08/1975, 47 y.o.   MRN: [id number]  Chief Complaint  Patient presents with   Sinusitis    Sinusitis This is a new problem. Episode onset: 2 weeks. The problem has been gradually worsening since onset. There has been no fever. Associated symptoms include congestion, coughing, ear pain, headaches, sinus pressure, sneezing and a sore throat. Pertinent negatives include no chills, diaphoresis, hoarse voice or shortness of breath. Past treatments include oral decongestants (tylenol cold and flu, flonase). The treatment provided mild relief.    Review of Systems  Constitutional:  Negative for chills and diaphoresis.  HENT:  Positive for congestion, ear pain, sinus pressure, sneezing and sore throat. Negative for hoarse voice.   Respiratory:  Positive for cough. Negative for shortness of breath.   Neurological:  Positive for headaches.        Objective:    BP 121/74   Pulse 68   Temp 98.5 F (36.9 C) (Temporal)   Ht 5\' 3"  (1.6 m)   Wt 200 lb (90.7 kg)   SpO2 99%   BMI 35.43 kg/m    Physical Exam Vitals and nursing note reviewed.  Constitutional:      General: She is not in acute distress.    Appearance: She is not ill-appearing, toxic-appearing or diaphoretic.  HENT:     Right Ear: Ear canal and external ear normal. A middle ear effusion is present. Tympanic membrane is erythematous. Tympanic membrane is not retracted or bulging.     Left Ear: Ear canal and external ear normal. A middle ear effusion is present. Tympanic membrane is not erythematous, retracted or bulging.     Nose: Congestion present.     Right Sinus: Maxillary sinus tenderness and frontal sinus tenderness present.     Left Sinus: Maxillary sinus tenderness and frontal sinus tenderness present.     Mouth/Throat:     Mouth: Mucous membranes are moist.     Pharynx: Oropharynx is clear. No oropharyngeal exudate or posterior  oropharyngeal erythema.  Eyes:     General:        Right eye: No discharge.        Left eye: No discharge.     Conjunctiva/sclera: Conjunctivae normal.     Pupils: Pupils are equal, round, and reactive to light.  Cardiovascular:     Rate and Rhythm: Normal rate and regular rhythm.     Heart sounds: Normal heart sounds. No murmur heard. Pulmonary:     Effort: Pulmonary effort is normal. No respiratory distress.     Breath sounds: Normal breath sounds. No wheezing, rhonchi or rales.  Musculoskeletal:     Cervical back: Neck supple. No rigidity.     Right lower leg: No edema.     Left lower leg: No edema.  Lymphadenopathy:     Cervical: Cervical adenopathy present.  Skin:    General: Skin is warm and dry.  Neurological:     General: No focal deficit present.     Mental Status: She is alert and oriented to person, place, and time.  Psychiatric:        Mood and Affect: Mood normal.        Behavior: Behavior normal.     No results found for any visits on 10/16/22.      Assessment & Plan:   Carol Nguyen was seen today for sinusitis.  Diagnoses and all orders for this  visit:  Acute non-recurrent pansinusitis Augmentin as below. Discussed symptomatic care and return precautions.  -     amoxicillin-clavulanate (AUGMENTIN) 875-125 MG tablet; Take 1 tablet by mouth 2 (two) times daily for 7 days.  Allergic rhinitis Uncontrolled. Discussed OTC antihistamine with flonase.   Return if symptoms worsen or fail to improve.  The patient indicates understanding of these issues and agrees with the plan.  Gwenlyn Perking, FNP

## 2022-10-16 NOTE — Patient Instructions (Signed)

## 2022-11-05 ENCOUNTER — Ambulatory Visit: Payer: Medicaid Other | Admitting: Family Medicine

## 2022-11-05 ENCOUNTER — Encounter: Payer: Self-pay | Admitting: Family Medicine

## 2022-11-05 VITALS — BP 120/82 | HR 76 | Temp 97.8°F | Ht 63.0 in | Wt 204.4 lb

## 2022-11-05 DIAGNOSIS — Z8041 Family history of malignant neoplasm of ovary: Secondary | ICD-10-CM | POA: Diagnosis not present

## 2022-11-05 DIAGNOSIS — M4134 Thoracogenic scoliosis, thoracic region: Secondary | ICD-10-CM | POA: Diagnosis not present

## 2022-11-05 DIAGNOSIS — Z800 Family history of malignant neoplasm of digestive organs: Secondary | ICD-10-CM | POA: Diagnosis not present

## 2022-11-05 DIAGNOSIS — Z6836 Body mass index (BMI) 36.0-36.9, adult: Secondary | ICD-10-CM | POA: Diagnosis not present

## 2022-11-05 DIAGNOSIS — Z803 Family history of malignant neoplasm of breast: Secondary | ICD-10-CM

## 2022-11-05 DIAGNOSIS — M546 Pain in thoracic spine: Secondary | ICD-10-CM

## 2022-11-05 DIAGNOSIS — G8929 Other chronic pain: Secondary | ICD-10-CM

## 2022-11-05 DIAGNOSIS — E663 Overweight: Secondary | ICD-10-CM | POA: Diagnosis not present

## 2022-11-05 LAB — BAYER DCA HB A1C WAIVED: HB A1C (BAYER DCA - WAIVED): 5.4 % (ref 4.8–5.6)

## 2022-11-05 NOTE — Progress Notes (Signed)
Subjective:  Patient ID: Carol Nguyen, female    DOB: 15-Jun-1976, 47 y.o.   MRN: [id number]  Patient Care Team: Sonny Masters, FNP as PCP - General (Family Medicine)   Chief Complaint:  Establish Care (Former [facility] patient )   HPI: Carol Nguyen is a 47 y.o. female presenting on 11/05/2022 for Establish Care (Former [facility] patient )   Pt presents today to establish care with new PCP. She has not been seen for regular follow up in a while. She is currently not on medications. She was seen at [facility] for an URI and CXR revealed thoracic scoliosis. She states she has had chronic mid back pain for several year but was never diagnosed with scoliosis until this UC visit. She has not seen anyone for this. She has a significant family history of cancer. Mother died from breast cancer. Sister died from ovarian cancer. Aunt died from ovarian cancer. Grandmother diet from stomach cancer. Unknown if genetically driven and has not been tested. States she has had to have a breast biopsy in the past and it was negative. She has not had a mammogram recently as she had a lapse in insurance. PAP has been over 3 years ago. She denies specific concerns today.    Relevant past medical, surgical, family, and social history reviewed and updated as indicated.  Allergies and medications reviewed and updated. Data reviewed: Chart in Epic.   Past Medical History:  Diagnosis Date   Migraine    Psoriasis    Scoliosis     Past Surgical History:  Procedure Laterality Date   CESAREAN SECTION     2   SHOULDER SURGERY Left    TUBAL LIGATION      Social History   Socioeconomic History   Marital status: Married    Spouse name: Marina Goodell   Number of children: 3   Years of education: Not on file   Highest education level: Not on file  Occupational History    Comment: airport  Tobacco Use   Smoking status: Every Day    Packs/day: .5    Types: Cigarettes   Smokeless tobacco: Never  Vaping Use   Vaping Use: Never  used  Substance and Sexual Activity   Alcohol use: No   Drug use: No   Sexual activity: Yes    Birth control/protection: Surgical  Other Topics Concern   Not on file  Social History Narrative   Not on file   Social Determinants of Health   Financial Resource Strain: Not on file  Food Insecurity: Not on file  Transportation Needs: Not on file  Physical Activity: Not on file  Stress: Not on file  Social Connections: Not on file  Intimate Partner Violence: Not on file    Outpatient Encounter Medications as of 11/05/2022  Medication Sig   albuterol (VENTOLIN HFA) 108 (90 Base) MCG/ACT inhaler Inhale 2 puffs into the lungs.   cyclobenzaprine (FLEXERIL) 5 MG tablet Take 5 mg by mouth 3 (three) times daily as needed for muscle spasms.   fluticasone (FLONASE) 50 MCG/ACT nasal spray Place 2 sprays into both nostrils daily.   triamcinolone ointment (KENALOG) 0.5 % Apply 1 Application topically 2 (two) times daily.   [DISCONTINUED] benzonatate (TESSALON) 100 MG capsule Take 100 mg by mouth every 6 (six) hours as needed for cough.   No facility-administered encounter medications on file as of 11/05/2022.    Allergies  Allergen Reactions   Sulfa Antibiotics Other (See Comments)  Nose bleed, hives    Review of Systems  Constitutional:  Negative for activity change, appetite change, chills, diaphoresis, fatigue, fever and unexpected weight change.  HENT: Negative.    Eyes: Negative.  Negative for photophobia and visual disturbance.  Respiratory:  Negative for cough, chest tightness and shortness of breath.   Cardiovascular:  Negative for chest pain, palpitations and leg swelling.  Gastrointestinal:  Negative for abdominal pain, blood in stool, constipation, diarrhea, nausea and vomiting.  Endocrine: Negative.  Negative for polydipsia, polyphagia and polyuria.  Genitourinary:  Negative for decreased urine volume, difficulty urinating, dysuria, frequency and urgency.  Musculoskeletal:   Positive for arthralgias and back pain. Negative for myalgias.  Skin: Negative.   Allergic/Immunologic: Negative.   Neurological:  Negative for dizziness and headaches.  Hematological: Negative.   Psychiatric/Behavioral:  Negative for confusion, hallucinations, sleep disturbance and suicidal ideas.   All other systems reviewed and are negative.       Objective:  BP 120/82   Pulse 76   Temp 97.8 F (36.6 C) (Temporal)   Ht 5\' 3"  (1.6 m)   Wt 204 lb 6.4 oz (92.7 kg)   LMP 10/31/2022   SpO2 95%   BMI 36.21 kg/m    Wt Readings from Last 3 Encounters:  11/05/22 204 lb 6.4 oz (92.7 kg)  10/16/22 200 lb (90.7 kg)  09/23/22 200 lb 6.4 oz (90.9 kg)    Physical Exam Vitals and nursing note reviewed.  Constitutional:      General: She is not in acute distress.    Appearance: Normal appearance. She is well-developed and well-groomed. She is obese. She is not ill-appearing, toxic-appearing or diaphoretic.  HENT:     Head: Normocephalic and atraumatic.     Jaw: There is normal jaw occlusion.     Right Ear: Hearing normal.     Left Ear: Hearing normal.     Nose: Nose normal.     Mouth/Throat:     Lips: Pink.     Mouth: Mucous membranes are moist.     Pharynx: Oropharynx is clear. Uvula midline.  Eyes:     General: Lids are normal.     Extraocular Movements: Extraocular movements intact.     Conjunctiva/sclera: Conjunctivae normal.     Pupils: Pupils are equal, round, and reactive to light.  Neck:     Thyroid: No thyroid mass, thyromegaly or thyroid tenderness.     Vascular: No carotid bruit or JVD.     Trachea: Trachea and phonation normal.  Cardiovascular:     Rate and Rhythm: Normal rate and regular rhythm.     Chest Wall: PMI is not displaced.     Pulses: Normal pulses.     Heart sounds: Normal heart sounds. No murmur heard.    No friction rub. No gallop.  Pulmonary:     Effort: Pulmonary effort is normal. No respiratory distress.     Breath sounds: Normal breath  sounds. No wheezing.  Abdominal:     General: Bowel sounds are normal. There is no abdominal bruit.     Palpations: Abdomen is soft. There is no hepatomegaly or splenomegaly.     Tenderness: There is no abdominal tenderness.  Musculoskeletal:     Cervical back: Normal, normal range of motion and neck supple.     Thoracic back: Decreased range of motion. Scoliosis present.     Lumbar back: Normal.     Right lower leg: No edema.     Left lower leg: No edema.  Lymphadenopathy:     Cervical: No cervical adenopathy.  Skin:    General: Skin is warm and dry.     Capillary Refill: Capillary refill takes less than 2 seconds.     Coloration: Skin is not cyanotic, jaundiced or pale.     Findings: No rash.  Neurological:     General: No focal deficit present.     Mental Status: She is alert and oriented to person, place, and time.     Sensory: Sensation is intact.     Motor: Motor function is intact.     Coordination: Coordination is intact.     Gait: Gait is intact.     Deep Tendon Reflexes: Reflexes are normal and symmetric.  Psychiatric:        Attention and Perception: Attention and perception normal.        Mood and Affect: Mood and affect normal.        Speech: Speech normal.        Behavior: Behavior normal. Behavior is cooperative.        Thought Content: Thought content normal.        Cognition and Memory: Cognition and memory normal.        Judgment: Judgment normal.     Results for orders placed or performed during the hospital encounter of 11/02/19  Comprehensive metabolic panel  Result Value Ref Range   Sodium 139 135 - 145 mmol/L   Potassium 3.7 3.5 - 5.1 mmol/L   Chloride 105 98 - 111 mmol/L   CO2 27 22 - 32 mmol/L   Glucose, Bld 104 (H) 70 - 99 mg/dL   BUN 11 6 - 20 mg/dL   Creatinine, Ser 6.040.62 0.44 - 1.00 mg/dL   Calcium 9.1 8.9 - 54.010.3 mg/dL   Total Protein 6.8 6.5 - 8.1 g/dL   Albumin 4.3 3.5 - 5.0 g/dL   AST 14 (L) 15 - 41 U/L   ALT 16 0 - 44 U/L   Alkaline  Phosphatase 81 38 - 126 U/L   Total Bilirubin 0.5 0.3 - 1.2 mg/dL   GFR calc non Af Amer >60 >60 mL/min   GFR calc Af Amer >60 >60 mL/min   Anion gap 7 5 - 15  Lipid panel  Result Value Ref Range   Cholesterol 176 0 - 200 mg/dL   Triglycerides 57 <981<150 mg/dL   HDL 43 >19>40 mg/dL   Total CHOL/HDL Ratio 4.1 RATIO   VLDL 11 0 - 40 mg/dL   LDL Cholesterol 147122 (H) 0 - 99 mg/dL  Hemoglobin W2NA1c  Result Value Ref Range   Hgb A1c MFr Bld 5.3 4.8 - 5.6 %   Mean Plasma Glucose 105 mg/dL       Pertinent labs & imaging results that were available during my care of the patient were reviewed by me and considered in my medical decision making.  Assessment & Plan:  Oretha CapriceKandis was seen today for establish care.  Diagnoses and all orders for this visit:  BMI 36.0-36.9,adult Diet and exercise encouraged. Labs pending.  -     CBC with Differential/Platelet -     CMP14+EGFR -     Thyroid Panel With TSH -     Bayer DCA Hb A1c Waived  Family history of breast cancer in first degree relative Family history of ovarian cancer Family history of stomach cancer  Concerns for genetic etiology. Referral to genetics placed.  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 Pt has continued mid back pain, will refer to ortho.  -     Ambulatory referral to Orthopedic Surgery     Continue all other maintenance medications.  Follow up plan: Return in about 3 months (around 02/04/2023), or if symptoms worsen or fail to improve, for CPE with PAP.   Continue healthy lifestyle choices, including diet (rich in fruits, vegetables, and lean proteins, and low in salt and simple carbohydrates) and exercise (at least 30 minutes of moderate physical activity daily).  Educational handout given for health maintenance   The above assessment and management plan was discussed with the patient. The patient verbalized understanding  of and has agreed to the management plan. Patient is aware to call the clinic if they develop any new symptoms or if symptoms persist or worsen. Patient is aware when to return to the clinic for a follow-up visit. Patient educated on when it is appropriate to go to the emergency department.   Kari Baars, FNP-C [facility] [phone]

## 2022-11-06 LAB — CMP14+EGFR
ALT: 18 IU/L (ref 0–32)
AST: 15 IU/L (ref 0–40)
Albumin/Globulin Ratio: 2.3 — ABNORMAL HIGH (ref 1.2–2.2)
Albumin: 4.1 g/dL (ref 3.9–4.9)
Alkaline Phosphatase: 108 IU/L (ref 44–121)
BUN/Creatinine Ratio: 13 (ref 9–23)
BUN: 9 mg/dL (ref 6–24)
Bilirubin Total: 0.3 mg/dL (ref 0.0–1.2)
CO2: 24 mmol/L (ref 20–29)
Calcium: 8.9 mg/dL (ref 8.7–10.2)
Chloride: 104 mmol/L (ref 96–106)
Creatinine, Ser: 0.67 mg/dL (ref 0.57–1.00)
Globulin, Total: 1.8 g/dL (ref 1.5–4.5)
Glucose: 83 mg/dL (ref 70–99)
Potassium: 4.1 mmol/L (ref 3.5–5.2)
Sodium: 141 mmol/L (ref 134–144)
Total Protein: 5.9 g/dL — ABNORMAL LOW (ref 6.0–8.5)
eGFR: 110 mL/min/{1.73_m2} (ref >59)

## 2022-11-06 LAB — CBC WITH DIFFERENTIAL/PLATELET
Basophils Absolute: 0 10*3/uL (ref 0.0–0.2)
Basos: 0 %
EOS (ABSOLUTE): 0.1 10*3/uL (ref 0.0–0.4)
Eos: 2 %
Hematocrit: 43.3 % (ref 34.0–46.6)
Hemoglobin: 14.1 g/dL (ref 11.1–15.9)
Immature Grans (Abs): 0 10*3/uL (ref 0.0–0.1)
Immature Granulocytes: 0 %
Lymphocytes Absolute: 2.4 10*3/uL (ref 0.7–3.1)
Lymphs: 32 %
MCH: 30.2 pg (ref 26.6–33.0)
MCHC: 32.6 g/dL (ref 31.5–35.7)
MCV: 93 fL (ref 79–97)
Monocytes Absolute: 0.6 10*3/uL (ref 0.1–0.9)
Monocytes: 7 %
Neutrophils Absolute: 4.4 10*3/uL (ref 1.4–7.0)
Neutrophils: 59 %
Platelets: 279 10*3/uL (ref 150–450)
RBC: 4.67 x10E6/uL (ref 3.77–5.28)
RDW: 12.4 % (ref 11.7–15.4)
WBC: 7.5 10*3/uL (ref 3.4–10.8)

## 2022-11-06 LAB — THYROID PANEL WITH TSH
Free Thyroxine Index: 1.8 (ref 1.2–4.9)
T3 Uptake Ratio: 25 % (ref 24–39)
T4, Total: 7 ug/dL (ref 4.5–12.0)
TSH: 0.98 u[IU]/mL (ref 0.450–4.500)

## 2022-11-13 ENCOUNTER — Other Ambulatory Visit (INDEPENDENT_AMBULATORY_CARE_PROVIDER_SITE_OTHER): Payer: Medicaid Other

## 2022-11-13 ENCOUNTER — Telehealth: Payer: Self-pay | Admitting: Genetic Counselor

## 2022-11-13 ENCOUNTER — Encounter: Payer: Self-pay | Admitting: Orthopaedic Surgery

## 2022-11-13 ENCOUNTER — Ambulatory Visit: Payer: Medicaid Other | Admitting: Orthopaedic Surgery

## 2022-11-13 VITALS — Ht 63.0 in | Wt 204.0 lb

## 2022-11-13 DIAGNOSIS — G8929 Other chronic pain: Secondary | ICD-10-CM | POA: Diagnosis not present

## 2022-11-13 DIAGNOSIS — M545 Low back pain, unspecified: Secondary | ICD-10-CM

## 2022-11-13 NOTE — Telephone Encounter (Signed)
Reached out to patient per 4/17 IB left voicemail.

## 2022-11-13 NOTE — Progress Notes (Signed)
Office Visit Note   Patient: Carol Nguyen           Date of Birth: May 31, 1976           MRN: [id number] Visit Date: 11/13/2022              Requested by: Sonny Masters, FNP [address] [place],  [place] [address] PCP: Sonny Masters, FNP   Assessment & Plan: Visit Diagnoses:  1. Chronic left-sided low back pain, unspecified whether sciatica present     Plan: Will set her up to physical therapy for stretching strengthening evaluation and treatment for her thoracic pain.  Recheck 2 months.  Follow-Up Instructions: Return in about 2 months (around 01/13/2023).   Orders:  Orders Placed This Encounter  Procedures   XR SCOLIOSIS EVAL COMPLETE SPINE 2 OR 3 VIEWS   Ambulatory referral to Physical Therapy   No orders of the defined types were placed in this encounter.     Procedures: No procedures performed   Clinical Data: No additional findings.   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  She was seen in the ER had a portable x-ray obtained and was told she had some scoliosis.  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  Review of Systems positive family history of breast cancer patient's been biopsied which was negative and has yearly mammograms.  Mother died from breast cancer.  All systems noncontributory HPI.   Objective: Vital Signs: Ht  (1.6 m)   Wt 204 lb (92.5 kg)   LMP 10/31/2022   BMI 36.14 kg/m   Physical Exam Constitutional:      Appearance: She is well-developed.  HENT:     Head: Normocephalic.     Right Ear: External ear normal.     Left Ear: External ear normal. There is no impacted cerumen.  Eyes:     Pupils: Pupils are equal, round, and reactive to light.  Neck:     Thyroid: No thyromegaly.     Trachea: No tracheal  deviation.  Cardiovascular:     Rate and Rhythm: Normal rate.  Pulmonary:     Effort: Pulmonary effort is normal.  Abdominal:     Palpations: Abdomen is soft.  Musculoskeletal:     Cervical back: No rigidity.  Skin:    General: Skin is warm and dry.  Neurological:     Mental Status: She is alert and oriented to person, place, and time.  Psychiatric:        Behavior: Behavior normal.     Ortho Exam patient has right thoracic left lumbar curvature.  Scapular asymmetry.  Clavicles appear symmetrical.  Specialty Comments:  No specialty comments available.  Imaging: XR SCOLIOSIS EVAL COMPLETE SPINE 2 OR 3 VIEWS  Result Date: 11/13/2022 AP T-spine AP lumbar spine x-rays obtained and reviewed.  This shows T5-T11 curve measured from the top endplate of T5 to the inferior endplate of T11 measuring 40 degrees.  There is a left T12-L4 thoracolumbar curve measuring 32 degrees. Impression: Scoliosis with right thoracic left lumbar curvature measuring 40 and 32 degrees respectively.    PMFS History: Patient Active Problem List   Diagnosis Date Noted  BMI 36.0-36.9,adult 11/05/2022   Family history of breast cancer in first degree relative 11/05/2022   Family history of ovarian cancer 11/05/2022   Thoracogenic scoliosis of thoracic region 11/05/2022   Family history of stomach cancer 11/05/2022   Dry skin dermatitis 08/11/2022   Past Medical History:  Diagnosis Date   Migraine    Psoriasis    Scoliosis     Family History  Problem Relation Age of Onset   Cancer Mother    Breast cancer Mother    Diabetes Mother    Hypertension Mother    Stroke Father    Heart disease Father    Cancer Sister    Cancer Paternal Grandmother    Cancer Paternal Grandfather     Past Surgical History:  Procedure Laterality Date   CESAREAN SECTION     2   SHOULDER SURGERY Left    TUBAL LIGATION     Social History   Occupational History    Comment: airport  Tobacco Use   Smoking status:  Every Day    Packs/day: .5    Types: Cigarettes   Smokeless tobacco: Never  Vaping Use   Vaping Use: Never used  Substance and Sexual Activity   Alcohol use: No   Drug use: No   Sexual activity: Yes    Birth control/protection: Surgical

## 2022-11-20 DIAGNOSIS — G44209 Tension-type headache, unspecified, not intractable: Secondary | ICD-10-CM | POA: Diagnosis not present

## 2022-11-20 DIAGNOSIS — J0100 Acute maxillary sinusitis, unspecified: Secondary | ICD-10-CM | POA: Diagnosis not present

## 2022-11-25 ENCOUNTER — Other Ambulatory Visit: Payer: Self-pay

## 2022-11-25 ENCOUNTER — Ambulatory Visit: Payer: Medicaid Other | Attending: Orthopaedic Surgery

## 2022-11-25 DIAGNOSIS — M5459 Other low back pain: Secondary | ICD-10-CM | POA: Diagnosis not present

## 2022-11-25 DIAGNOSIS — G8929 Other chronic pain: Secondary | ICD-10-CM | POA: Insufficient documentation

## 2022-11-25 DIAGNOSIS — M6281 Muscle weakness (generalized): Secondary | ICD-10-CM | POA: Diagnosis not present

## 2022-11-25 DIAGNOSIS — M545 Low back pain, unspecified: Secondary | ICD-10-CM | POA: Insufficient documentation

## 2022-11-25 NOTE — Therapy (Signed)
OUTPATIENT PHYSICAL THERAPY THORACOLUMBAR EVALUATION   Patient Name: Carol Nguyen MRN: [id number] DOB:January 21, 1976, 47 y.o., female Today's Date: 11/25/2022  END OF SESSION:  PT End of Session - 11/25/22 1127     Visit Number 1    Number of Visits 10    Date for PT Re-Evaluation 01/23/23    PT Start Time 1120    PT Stop Time 1149    PT Time Calculation (min) 29 min    Activity Tolerance Patient tolerated treatment well    Behavior During Therapy WFL for tasks assessed/performed             Past Medical History:  Diagnosis Date   Migraine    Psoriasis    Scoliosis    Past Surgical History:  Procedure Laterality Date   CESAREAN SECTION     2   SHOULDER SURGERY Left    TUBAL LIGATION     Patient Active Problem List   Diagnosis Date Noted   BMI 36.0-36.9,adult 11/05/2022   Family history of breast cancer in first degree relative 11/05/2022   Family history of ovarian cancer 11/05/2022   Thoracogenic scoliosis of thoracic region 11/05/2022   Family history of stomach cancer 11/05/2022   Dry skin dermatitis 08/11/2022   REFERRING PROVIDER: Eldred Manges, MD   REFERRING DIAG: Chronic left-sided low back pain, unspecified whether sciatica present   Rationale for Evaluation and Treatment: Rehabilitation  THERAPY DIAG:  Other low back pain  Muscle weakness (generalized)  ONSET DATE: Approximately 1 month ago  SUBJECTIVE:                                                                                                                                                                                           SUBJECTIVE STATEMENT: Patient reports that her left low back has been bothering her for a while, but it got worse about a month. She notes that some days are better, but some days can be worse. She notes that she has scoliosis, but she has never had any pain like this before. She has noticed that she has to go slow and take her time when getting dressed. She even  has some days where she has to sit down to get her clothes on. She has found difficulty getting comfortable to sleep at night.   PERTINENT HISTORY:  Scoliosis and current smoker  PAIN:  Are you having pain? Yes: NPRS scale: 10/10 Pain location: left low back Pain description: intermittent burning Aggravating factors: yard work, Pharmacologist, standing, left side lying, lifting, work Relieving factors: taking a hot shower  PRECAUTIONS: None  WEIGHT BEARING RESTRICTIONS: No  FALLS:  Has patient fallen in last 6 months? 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   PATIENT SURVEYS:  Modified Oswestry 44/100   SCREENING FOR RED FLAGS: Bowel or bladder incontinence: No Spinal tumors: No Cauda equina syndrome: No Compression fracture: No Abdominal aneurysm: No  COGNITION: Overall cognitive status: Within functional limits for tasks assessed     SENSATION: Patient reports no numbness or tingling  POSTURE: rounded shoulders, forward head, and flexed trunk  (stands with 12 degrees of lumbar flexion)   PALPATION: Familiar pain reproduced by palpation to her left lumbar paraspinals, QL, obliques, and latissimus dorsi  LUMBAR ROM:   AROM eval  Flexion 80; familiar pain  Extension 4; familiar pain  Right lateral flexion 14; familiar pain  Left lateral flexion 8; familiar pain  Right rotation 50% limited; "not so bad"   Left  rotation 25% limited; "sore"    (Blank rows = not tested)  LOWER EXTREMITY ROM: WFL for activities assessed  LOWER EXTREMITY MMT:    MMT Right eval Left eval  Hip flexion 3+/5; sore 4-/5; sore  Hip extension    Hip abduction    Hip adduction    Hip internal rotation    Hip external rotation    Knee flexion 4+/5; sore and painful  4+/5; sore and painful   Knee extension 3/5 3/5  Ankle dorsiflexion 4/5 4/5  Ankle plantarflexion    Ankle inversion    Ankle eversion     (Blank rows = not tested)  LUMBAR SPECIAL TESTS:  Limited due to pain severity and irritability  GAIT: Assistive device utilized: None Level of assistance: Complete Independence Comments: Flexed trunk with reduced gait speed and stride length  TODAY'S TREATMENT:                                                                                                                              DATE:     PATIENT EDUCATION:  Education details: Plan of care, healing, prognosis, and goals for therapy Person educated: Patient Education method: Explanation Education comprehension: verbalized understanding  HOME EXERCISE PROGRAM:   ASSESSMENT:  CLINICAL IMPRESSION: Patient is a 47 y.o. female who was seen today for physical therapy evaluation and treatment for left-sided low back pain.  She presented with high pain severity and irritability with lumbar flexion and extension being the most aggravating to her familiar symptoms.  She also  exhibited reduced lumbar active range of motion and lower extremity muscular strength bilaterally.  Recommend that she continue with skilled physical therapy to address her impairments to maximize her functional mobility.  OBJECTIVE IMPAIRMENTS: Abnormal gait, decreased activity tolerance, decreased mobility, difficulty walking, decreased ROM, decreased strength, hypomobility, impaired tone, postural dysfunction, and pain.   ACTIVITY LIMITATIONS: carrying, lifting, bending, standing,  sleeping, stairs, transfers, bed mobility, dressing, and locomotion level  PARTICIPATION LIMITATIONS: meal prep, cleaning, laundry, shopping, community activity, occupation, and yard work  PERSONAL FACTORS: Profession and 1-2 comorbidities: Scoliosis and current smoker  are also affecting patient's functional outcome.   REHAB POTENTIAL: Fair    CLINICAL DECISION MAKING: Evolving/moderate complexity  EVALUATION COMPLEXITY: Moderate   GOALS: Goals reviewed with patient? Yes  SHORT TERM GOALS: Target date: 12/09/22  Patient will be independent with her initial HEP. Baseline: Goal status: INITIAL  2.  Patient will be able to complete her critical job demands without her familiar pain exceeding 8/10. Baseline:  Goal status: INITIAL  3.  Patient will improve her ODI score to 34% or less for improved function with her daily activities. Baseline:  Goal status: INITIAL  LONG TERM GOALS: Target date: 12/30/22  Patient will be independent with her advanced HEP. Baseline:  Goal status: INITIAL  2.  Patient will be able to complete her critical job demands without her familiar pain exceeding 6/10. Baseline:  Goal status: INITIAL  3.  Patient will be able to demonstrate at least 15 degrees of lumbar extension for improved function with overhead activities. Baseline:  Goal status: INITIAL  4.  Patient will be able to navigate at least 4 steps with a reciprocal pattern for improved household mobility. Baseline:  Goal status: INITIAL  5.  Patient will improve her ODI score to 24% or less for improved function with her daily activities. Baseline:  Goal status: INITIAL  PLAN:  PT FREQUENCY: 2x/week  PT DURATION: other: 5 weeks  PLANNED INTERVENTIONS: Therapeutic exercises, Therapeutic activity, Neuromuscular re-education, Balance training, Gait training, Patient/Family education, Self Care, Joint mobilization, Stair training, Spinal mobilization, Cryotherapy, Moist heat, Manual  therapy, and Re-evaluation.  PLAN FOR NEXT SESSION: NuStep, lumbar stabilization, postural reeducation, lower extremity strengthening, and manual therapy as needed   Granville Lewis, PT 11/25/2022, 12:46 PM

## 2022-12-09 ENCOUNTER — Ambulatory Visit: Payer: Medicaid Other | Attending: Orthopaedic Surgery

## 2022-12-09 DIAGNOSIS — M6281 Muscle weakness (generalized): Secondary | ICD-10-CM | POA: Diagnosis not present

## 2022-12-09 DIAGNOSIS — M5459 Other low back pain: Secondary | ICD-10-CM

## 2022-12-09 NOTE — Therapy (Signed)
OUTPATIENT PHYSICAL THERAPY THORACOLUMBAR TREATMENT   Patient Name: Carol Nguyen MRN: [id number] DOB:[birth year], 47 y.o., female Today's Date: 12/09/2022  END OF SESSION:  PT End of Session - 12/09/22 1632     Visit Number 2    Number of Visits 10    Date for PT Re-Evaluation 01/23/23    Authorization Type [employer]    Authorization Time Period 12/02/22-02/19/23    Authorization - Number of Visits 6    PT Start Time 1633    PT Stop Time 1717    PT Time Calculation (min) 44 min    Activity Tolerance Patient tolerated treatment well    Behavior During Therapy [facility] for tasks assessed/performed              Past Medical History:  Diagnosis Date   Migraine    Psoriasis    Scoliosis    Past Surgical History:  Procedure Laterality Date   CESAREAN SECTION     2   SHOULDER SURGERY Left    TUBAL LIGATION     Patient Active Problem List   Diagnosis Date Noted   BMI 36.0-36.9,adult 11/05/2022   Family history of breast cancer in first degree relative 11/05/2022   Family history of ovarian cancer 11/05/2022   Thoracogenic scoliosis of thoracic region 11/05/2022   Family history of stomach cancer 11/05/2022   Dry skin dermatitis 08/11/2022   REFERRING PROVIDER: Eldred Manges, MD   REFERRING DIAG: Chronic left-sided low back pain, unspecified whether sciatica present   Rationale for Evaluation and Treatment: Rehabilitation  THERAPY DIAG:  Other low back pain  Muscle weakness (generalized)  ONSET DATE: Approximately 1 month ago  SUBJECTIVE:                                                                                                                                                                                           SUBJECTIVE STATEMENT: Patient reports that her back is really sore today from working today. She notes that she tried to mow her yard, but had to stop every 5-10 minutes due to her pain.   PERTINENT HISTORY:  Scoliosis and current  smoker  PAIN:  Are you having pain? Yes: NPRS scale: 10/10 Pain location: left low back Pain description: intermittent burning Aggravating factors: yard work, Pharmacologist, standing, left side lying, lifting, work Relieving factors: taking a hot shower  PRECAUTIONS: None  WEIGHT BEARING RESTRICTIONS: No  FALLS:  Has patient fallen in last 6 months? 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   PATIENT SURVEYS:  Modified Oswestry 44/100   SCREENING FOR RED FLAGS: Bowel or bladder incontinence: No Spinal tumors: No Cauda equina syndrome: No Compression fracture: No Abdominal aneurysm: No  COGNITION: Overall cognitive status: Within functional limits for tasks assessed     SENSATION: Patient reports no numbness or tingling  POSTURE: rounded shoulders, forward head, and flexed trunk  (stands with 12 degrees of lumbar flexion)   PALPATION: Familiar pain reproduced by palpation to her left lumbar paraspinals, QL, obliques, and latissimus dorsi  LUMBAR ROM:   AROM eval  Flexion 80; familiar pain  Extension 4; familiar pain  Right lateral flexion 14; familiar pain  Left lateral flexion 8; familiar pain  Right rotation 50% limited; "not so bad"   Left rotation 25% limited; "sore"    (Blank rows = not tested)  LOWER EXTREMITY ROM: WFL for activities assessed  LOWER EXTREMITY MMT:    MMT Right eval Left eval  Hip  flexion 3+/5; sore 4-/5; sore  Hip extension    Hip abduction    Hip adduction    Hip internal rotation    Hip external rotation    Knee flexion 4+/5; sore and painful  4+/5; sore and painful   Knee extension 3/5 3/5  Ankle dorsiflexion 4/5 4/5  Ankle plantarflexion    Ankle inversion    Ankle eversion     (Blank rows = not tested)  LUMBAR SPECIAL TESTS:  Limited due to pain severity and irritability  GAIT: Assistive device utilized: None Level of assistance: Complete Independence Comments: Flexed trunk with reduced gait speed and stride length  TODAY'S TREATMENT:                                                                                                                              DATE:                                     12/09/22 EXERCISE LOG  Exercise Repetitions and Resistance Comments  Nustep  L2 x 16 minutes Reduced pain to a 6/10  Isometric ball press  2 minutes w/ 5 second hold   Slouch overcorrect 20 reps   Aggravated cervical pain   LAQ 3# x 20 reps each    Seated hip ADD isometric 3 minutes w/ 5 second hold    Seated march  3# x 15 reps each     Blank cell = exercise  not performed today   PATIENT EDUCATION:  Education details: Plan of care, healing, referred pain, and the need for lumbar stability for lower extremity movement Person educated: Patient Education method: Explanation Education comprehension: verbalized understanding  HOME EXERCISE PROGRAM:   ASSESSMENT:  CLINICAL IMPRESSION: Patient was introduced to multiple new interventions for improved lumbar and lower extremity strength needed for improved function with her daily activities. She experienced the most significant reduction in her familiar symptoms with the NuStep. She required minimal cueing with today's new interventions for proper exercise performance and pacing to avoid aggravating her familiar symptoms. She reported feeling alright upon the conclusion of treatment. She continues to  require skilled physical therapy to address her remaining impairments to return to her prior level of function.   OBJECTIVE IMPAIRMENTS: Abnormal gait, decreased activity tolerance, decreased mobility, difficulty walking, decreased ROM, decreased strength, hypomobility, impaired tone, postural dysfunction, and pain.   ACTIVITY LIMITATIONS: carrying, lifting, bending, standing, sleeping, stairs, transfers, bed mobility, dressing, and locomotion level  PARTICIPATION LIMITATIONS: meal prep, cleaning, laundry, shopping, community activity, occupation, and yard work  PERSONAL FACTORS: Profession and 1-2 comorbidities: Scoliosis and current smoker  are also affecting patient's functional outcome.   REHAB POTENTIAL: Fair    CLINICAL DECISION MAKING: Evolving/moderate complexity  EVALUATION COMPLEXITY: Moderate   GOALS: Goals reviewed with patient? Yes  SHORT TERM GOALS: Target date: 12/09/22  Patient will be independent with her initial HEP. Baseline: Goal status: INITIAL  2.  Patient will be able to complete her critical job demands without her familiar pain exceeding 8/10. Baseline:  Goal status: INITIAL  3.  Patient will improve her ODI score to 34% or less for improved function with her daily activities. Baseline:  Goal status: INITIAL  LONG TERM GOALS: Target date: 12/30/22  Patient will be independent with her advanced HEP. Baseline:  Goal status: INITIAL  2.  Patient will be able to complete her critical job demands without her familiar pain exceeding 6/10. Baseline:  Goal status: INITIAL  3.  Patient will be able to demonstrate at least 15 degrees of lumbar extension for improved function with overhead activities. Baseline:  Goal status: INITIAL  4.  Patient will be able to navigate at least 4 steps with a reciprocal pattern for improved household mobility. Baseline:  Goal status: INITIAL  5.  Patient will improve her ODI score to 24% or less for improved function  with her daily activities. Baseline:  Goal status: INITIAL  PLAN:  PT FREQUENCY: 2x/week  PT DURATION: other: 5 weeks  PLANNED INTERVENTIONS: Therapeutic exercises, Therapeutic activity, Neuromuscular re-education, Balance training, Gait training, Patient/Family education, Self Care, Joint mobilization, Stair training, Spinal mobilization, Cryotherapy, Moist heat, Manual therapy, and Re-evaluation.  PLAN FOR NEXT SESSION: NuStep, lumbar stabilization, postural reeducation, lower extremity strengthening, and manual therapy as needed   Granville Lewis, PT 12/09/2022, 5:44 PM

## 2022-12-16 ENCOUNTER — Ambulatory Visit: Payer: Medicaid Other | Admitting: *Deleted

## 2022-12-16 DIAGNOSIS — M6281 Muscle weakness (generalized): Secondary | ICD-10-CM | POA: Diagnosis not present

## 2022-12-16 DIAGNOSIS — M5459 Other low back pain: Secondary | ICD-10-CM

## 2022-12-16 NOTE — Therapy (Signed)
OUTPATIENT PHYSICAL THERAPY THORACOLUMBAR TREATMENT   Patient Name: Carol Nguyen MRN: [id number] DOB:Jan 03, 1976, 47 y.o., female Today's Date: 12/16/2022  END OF SESSION:  PT End of Session - 12/16/22 1631     Visit Number 3    Number of Visits 10    Date for PT Re-Evaluation 01/23/23    Authorization Type [employer]    Authorization Time Period 12/02/22-02/19/23    Authorization - Number of Visits 6    PT Start Time 1631    PT Stop Time 1720    PT Time Calculation (min) 49 min              Past Medical History:  Diagnosis Date   Migraine    Psoriasis    Scoliosis    Past Surgical History:  Procedure Laterality Date   CESAREAN SECTION     2   SHOULDER SURGERY Left    TUBAL LIGATION     Patient Active Problem List   Diagnosis Date Noted   BMI 36.0-36.9,adult 11/05/2022   Family history of breast cancer in first degree relative 11/05/2022   Family history of ovarian cancer 11/05/2022   Thoracogenic scoliosis of thoracic region 11/05/2022   Family history of stomach cancer 11/05/2022   Dry skin dermatitis 08/11/2022   REFERRING PROVIDER: Eldred Manges, MD   REFERRING DIAG: Chronic left-sided low back pain, unspecified whether sciatica present   Rationale for Evaluation and Treatment: Rehabilitation  THERAPY DIAG:  Other low back pain  Muscle weakness (generalized)  ONSET DATE: Approximately 1 month ago  SUBJECTIVE:                                                                                                                                                                                           SUBJECTIVE STATEMENT: Patient reports being very tired and her LT LE having increased pain. LT side very sore today   PERTINENT HISTORY:  Scoliosis and current smoker  PAIN:  Are you having pain? Yes: NPRS scale: 8/10 Pain location: left low back Pain description: intermittent burning Aggravating factors: yard work, Pharmacologist, standing, left side lying,  lifting, work Relieving factors: taking a hot shower  PRECAUTIONS: None  WEIGHT BEARING RESTRICTIONS: No  FALLS:  Has patient fallen in last 6 months? 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   PATIENT SURVEYS:  Modified Oswestry 44/100   SCREENING FOR RED FLAGS: Bowel or bladder incontinence: No Spinal tumors: No Cauda equina syndrome: No Compression fracture: No Abdominal aneurysm: No  COGNITION: Overall cognitive status: Within functional limits for tasks assessed     SENSATION: Patient reports no numbness or tingling  POSTURE: rounded shoulders, forward head, and flexed trunk  (stands with 12 degrees of lumbar flexion)   PALPATION: Familiar pain reproduced by palpation to her left lumbar paraspinals, QL, obliques, and latissimus dorsi  LUMBAR ROM:   AROM eval  Flexion 80; familiar pain  Extension 4; familiar pain  Right lateral flexion 14; familiar pain  Left lateral flexion 8; familiar pain  Right rotation 50% limited; "not so bad"   Left rotation 25% limited; "sore"    (Blank rows = not tested)  LOWER EXTREMITY ROM: WFL for activities assessed  LOWER EXTREMITY MMT:    MMT Right eval Left eval  Hip flexion 3+/5; sore 4-/5; sore  Hip extension    Hip abduction    Hip adduction    Hip internal rotation    Hip external rotation    Knee flexion 4+/5; sore and painful  4+/5; sore and painful    Knee extension 3/5 3/5  Ankle dorsiflexion 4/5 4/5  Ankle plantarflexion    Ankle inversion    Ankle eversion     (Blank rows = not tested)  LUMBAR SPECIAL TESTS:  Limited due to pain severity and irritability  GAIT: Assistive device utilized: None Level of assistance: Complete Independence Comments: Flexed trunk with reduced gait speed and stride length  TODAY'S TREATMENT:                                                                                                                              DATE:                                     12/16/22 EXERCISE LOG  Exercise Repetitions and Resistance Comments  Nustep  L2 x 15 minutes Reduced pain to a 6/10  Isometric ball press  3 minutes w/ 5 second hold   Slouch overcorrect  Aggravated cervical pain   LAQ    Seated hip ADD isometric    Seated march      Blank cell = exercise not performed today   Discussed walking program and benefits  Manual STW    to LT side QL and thoracolumbar paras with Pt sitting   PATIENT EDUCATION:  Education details: Plan of care, healing, referred pain, and the need for lumbar stability for lower extremity movement Person educated: Patient Education  method: Explanation Education comprehension: verbalized understanding  HOME EXERCISE PROGRAM:   ASSESSMENT:  CLINICAL IMPRESSION: Patient arrived today very tired  and with her LT side and LT LE hurting. She was able to continue with some of the exs f/b STW to LT side QL and thoracolumbar paras with Pt in the seated position. Pt with notable tenderness / tightness during STW.     OBJECTIVE IMPAIRMENTS: Abnormal gait, decreased activity tolerance, decreased mobility, difficulty walking, decreased ROM, decreased strength, hypomobility, impaired tone, postural dysfunction, and pain.   ACTIVITY LIMITATIONS: carrying, lifting, bending, standing, sleeping, stairs, transfers, bed mobility, dressing, and locomotion level  PARTICIPATION LIMITATIONS:  meal prep, cleaning, laundry, shopping, community activity, occupation, and yard work  PERSONAL FACTORS: Profession and 1-2 comorbidities: Scoliosis and current smoker  are also affecting patient's functional outcome.   REHAB POTENTIAL: Fair    CLINICAL DECISION MAKING: Evolving/moderate complexity  EVALUATION COMPLEXITY: Moderate   GOALS: Goals reviewed with patient? Yes  SHORT TERM GOALS: Target date: 12/09/22  Patient will be independent with her initial HEP. Baseline: Goal status: INITIAL  2.  Patient will be able to complete her critical job demands without her familiar pain exceeding 8/10. Baseline:  Goal status: INITIAL  3.  Patient will improve her ODI score to 34% or less for improved function with her daily activities. Baseline:  Goal status: INITIAL  LONG TERM GOALS: Target date: 12/30/22  Patient will be independent with her advanced HEP. Baseline:  Goal status: INITIAL  2.  Patient will be able to complete her critical job demands without her familiar pain exceeding 6/10. Baseline:  Goal status: INITIAL  3.  Patient will be able to demonstrate at least 15 degrees of lumbar extension for improved function with overhead activities. Baseline:  Goal status: INITIAL  4.  Patient will be able to navigate at least 4 steps with a reciprocal pattern for improved household mobility. Baseline:  Goal status: INITIAL  5.  Patient will improve her ODI score to 24% or less for improved function with her daily activities. Baseline:  Goal status: INITIAL  PLAN:  PT FREQUENCY: 2x/week  PT DURATION: other: 5 weeks  PLANNED INTERVENTIONS: Therapeutic exercises, Therapeutic activity, Neuromuscular re-education, Balance training, Gait training, Patient/Family education, Self Care, Joint mobilization, Stair training, Spinal mobilization, Cryotherapy, Moist heat, Manual therapy, and Re-evaluation.  PLAN FOR NEXT SESSION: NuStep, lumbar stabilization, postural reeducation,  lower extremity strengthening, and manual therapy as needed   Clarita Mcelvain,CHRIS, PTA 12/16/2022, 6:02 PM

## 2022-12-23 ENCOUNTER — Ambulatory Visit: Payer: Medicaid Other

## 2022-12-23 DIAGNOSIS — M5459 Other low back pain: Secondary | ICD-10-CM | POA: Diagnosis not present

## 2022-12-23 DIAGNOSIS — M6281 Muscle weakness (generalized): Secondary | ICD-10-CM

## 2022-12-23 NOTE — Therapy (Signed)
OUTPATIENT PHYSICAL THERAPY THORACOLUMBAR TREATMENT   Patient Name: Carol Nguyen MRN: [id number] DOB:September 23, 1975, 47 y.o., female Today's Date: 12/23/2022  END OF SESSION:  PT End of Session - 12/23/22 1652     Visit Number 4    Number of Visits 10    Date for PT Re-Evaluation 01/23/23    Authorization Type [employer]    Authorization Time Period 12/02/22-02/19/23    Authorization - Number of Visits 6    PT Start Time 1641    PT Stop Time 1730    PT Time Calculation (min) 49 min    Activity Tolerance Patient limited by pain    Behavior During Therapy [facility] for tasks assessed/performed               Past Medical History:  Diagnosis Date   Migraine    Psoriasis    Scoliosis    Past Surgical History:  Procedure Laterality Date   CESAREAN SECTION     2   SHOULDER SURGERY Left    TUBAL LIGATION     Patient Active Problem List   Diagnosis Date Noted   BMI 36.0-36.9,adult 11/05/2022   Family history of breast cancer in first degree relative 11/05/2022   Family history of ovarian cancer 11/05/2022   Thoracogenic scoliosis of thoracic region 11/05/2022   Family history of stomach cancer 11/05/2022   Dry skin dermatitis 08/11/2022   REFERRING PROVIDER: Eldred Manges, MD   REFERRING DIAG: Chronic left-sided low back pain, unspecified whether sciatica present   Rationale for Evaluation and Treatment: Rehabilitation  THERAPY DIAG:  Other low back pain  Muscle weakness (generalized)  ONSET DATE: Approximately 1 month ago  SUBJECTIVE:                                                                                                                                                                                           SUBJECTIVE STATEMENT: Patient reports that her left kneecap was swollen on Sunday while she was working. She notes that she was really hurting around 2:30 this afternoon while she was at work.   PERTINENT HISTORY:  Scoliosis and current  smoker  PAIN:  Are you having pain? Yes: NPRS scale: 9 (back) 10 (left leg) /10 Pain location: left low back Pain description: intermittent burning Aggravating factors: yard work, Pharmacologist, standing, left side lying, lifting, work Relieving factors: taking a hot shower  PRECAUTIONS: None  WEIGHT BEARING RESTRICTIONS: No  FALLS:  Has patient fallen in last 6 months? 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   PATIENT SURVEYS:  Modified Oswestry 44/100   SCREENING FOR RED FLAGS: Bowel or bladder incontinence: No Spinal tumors: No Cauda equina syndrome: No Compression fracture: No Abdominal aneurysm: No  COGNITION: Overall cognitive status: Within functional limits for tasks assessed     SENSATION: Patient reports no numbness or tingling  POSTURE: rounded shoulders, forward head, and flexed trunk  (stands with 12 degrees of lumbar flexion)   PALPATION: Familiar pain reproduced by palpation to her left lumbar paraspinals, QL, obliques, and latissimus dorsi  LUMBAR ROM:   AROM eval  Flexion 80; familiar pain  Extension 4; familiar pain  Right lateral flexion 14; familiar pain  Left lateral flexion 8; familiar pain  Right rotation 50% limited; "not so bad"   Left rotation 25% limited; "sore"    (Blank rows = not tested)  LOWER EXTREMITY ROM: WFL for activities assessed  LOWER EXTREMITY MMT:    MMT  Right eval Left eval  Hip flexion 3+/5; sore 4-/5; sore  Hip extension    Hip abduction    Hip adduction    Hip internal rotation    Hip external rotation    Knee flexion 4+/5; sore and painful  4+/5; sore and painful   Knee extension 3/5 3/5  Ankle dorsiflexion 4/5 4/5  Ankle plantarflexion    Ankle inversion    Ankle eversion     (Blank rows = not tested)  LUMBAR SPECIAL TESTS:  Limited due to pain severity and irritability  GAIT: Assistive device utilized: None Level of assistance: Complete Independence Comments: Flexed trunk with reduced gait speed and stride length  TODAY'S TREATMENT:                                                                                                                              DATE:                                     12/23/22 EXERCISE LOG  Exercise Repetitions and Resistance Comments  Nustep  L2 x 20 minutes Reduced back pain  Seated march  13 reps each    Seated hip ADD isometric  2 minutes w/ 5 second hold   Seated hip ABD isometric  3 minutes w/ 5 second hold    Seated heel raise  20 reps    Seated toe raise  20 reps     Blank cell = exercise  not performed today  Manual Therapy Soft Tissue Mobilization: left latissimus dorsi, for reduced pain and tone Joint Mobilizations: L1-2 CPA's , grade I-II for reduced pain                                      12/16/22 EXERCISE LOG  Exercise Repetitions and Resistance Comments  Nustep  L2 x 15 minutes Reduced pain to a 6/10  Isometric ball press  3 minutes w/ 5 second hold   Slouch overcorrect  Aggravated cervical pain   LAQ    Seated hip ADD isometric    Seated march      Blank cell = exercise not performed today   Discussed walking program and benefits  Manual STW    to LT side QL and thoracolumbar paras with Pt sitting   PATIENT EDUCATION:  Education details: Plan of care, healing, referred pain, and the need for lumbar stability for lower extremity movement Person educated:  Patient Education method: Explanation Education comprehension: verbalized understanding  HOME EXERCISE PROGRAM:   ASSESSMENT:  CLINICAL IMPRESSION: Patient was introduced to seated hip abduction isometrics for hip abductor engagement. She required minimal cueing with this new intervention for proper exercise performance to facilitate proper muscular engagement. Manual therapy focused on light upper lumbar joint mobilizations and soft tissue mobilization to the latissimus dorsi with minimal relief. Soft tissue mobilization to the left lumbar paraspinals and surrounding musculature was attempted, but was limited due to her pain severity and irritability. She reported that her back felt "alright" and her legs felt "sore" upon the conclusion of treatment. She continues to require skilled physical therapy to address her remaining impairments to maximize her functional mobility.   OBJECTIVE IMPAIRMENTS: Abnormal gait, decreased activity tolerance, decreased mobility, difficulty walking, decreased ROM, decreased strength, hypomobility, impaired tone, postural dysfunction, and pain.   ACTIVITY LIMITATIONS: carrying, lifting, bending, standing, sleeping, stairs, transfers, bed mobility, dressing, and locomotion level  PARTICIPATION LIMITATIONS: meal prep, cleaning, laundry, shopping, community activity, occupation, and yard work  PERSONAL FACTORS: Profession and 1-2 comorbidities: Scoliosis and current smoker  are also affecting patient's functional outcome.   REHAB POTENTIAL: Fair    CLINICAL DECISION MAKING: Evolving/moderate complexity  EVALUATION COMPLEXITY: Moderate   GOALS: Goals reviewed with patient? Yes  SHORT TERM GOALS: Target date: 12/09/22  Patient will be independent with her initial HEP. Baseline: Goal status: INITIAL  2.  Patient will be able to complete her critical job demands without her familiar pain exceeding 8/10. Baseline:  Goal status: INITIAL  3.  Patient will  improve her ODI score to 34% or less for improved function with her daily activities. Baseline:  Goal status: INITIAL  LONG TERM GOALS: Target date: 12/30/22  Patient will be independent with her advanced HEP. Baseline:  Goal status: INITIAL  2.  Patient will be able to complete her critical job demands without her familiar pain exceeding 6/10. Baseline:  Goal status: INITIAL  3.  Patient will be able to demonstrate at least 15 degrees of lumbar extension for improved function with overhead activities. Baseline:  Goal status: INITIAL  4.  Patient will be able to navigate at least 4 steps with a reciprocal pattern for improved household mobility. Baseline:  Goal status: INITIAL  5.  Patient will improve her ODI score to 24% or less for improved function with her daily activities. Baseline:  Goal status: INITIAL  PLAN:  PT FREQUENCY:  2x/week  PT DURATION: other: 5 weeks  PLANNED INTERVENTIONS: Therapeutic exercises, Therapeutic activity, Neuromuscular re-education, Balance training, Gait training, Patient/Family education, Self Care, Joint mobilization, Stair training, Spinal mobilization, Cryotherapy, Moist heat, Manual therapy, and Re-evaluation.  PLAN FOR NEXT SESSION: NuStep, lumbar stabilization, postural reeducation, lower extremity strengthening, and manual therapy as needed   Granville Lewis, PT 12/23/2022, 5:51 PM

## 2022-12-30 ENCOUNTER — Ambulatory Visit: Payer: Medicaid Other | Attending: Orthopaedic Surgery

## 2022-12-30 DIAGNOSIS — M6281 Muscle weakness (generalized): Secondary | ICD-10-CM | POA: Diagnosis not present

## 2022-12-30 DIAGNOSIS — M5459 Other low back pain: Secondary | ICD-10-CM | POA: Insufficient documentation

## 2022-12-30 NOTE — Therapy (Signed)
OUTPATIENT PHYSICAL THERAPY THORACOLUMBAR TREATMENT   Patient Name: Carol Nguyen MRN: [id number] DOB:1975-10-03, 47 y.o., female Today's Date: 12/30/2022  END OF SESSION:  PT End of Session - 12/30/22 1632     Visit Number 5    Number of Visits 10    Date for PT Re-Evaluation 01/23/23    Authorization Type [employer]    Authorization Time Period 12/02/22-02/19/23    Authorization - Number of Visits 6    PT Start Time 1629    PT Stop Time 1716    PT Time Calculation (min) 47 min    Activity Tolerance Patient limited by pain    Behavior During Therapy [facility] for tasks assessed/performed               Past Medical History:  Diagnosis Date   Migraine    Psoriasis    Scoliosis    Past Surgical History:  Procedure Laterality Date   CESAREAN SECTION     2   SHOULDER SURGERY Left    TUBAL LIGATION     Patient Active Problem List   Diagnosis Date Noted   BMI 36.0-36.9,adult 11/05/2022   Family history of breast cancer in first degree relative 11/05/2022   Family history of ovarian cancer 11/05/2022   Thoracogenic scoliosis of thoracic region 11/05/2022   Family history of stomach cancer 11/05/2022   Dry skin dermatitis 08/11/2022   REFERRING PROVIDER: Eldred Manges, MD   REFERRING DIAG: Chronic left-sided low back pain, unspecified whether sciatica present   Rationale for Evaluation and Treatment: Rehabilitation  THERAPY DIAG:  Other low back pain  Muscle weakness (generalized)  ONSET DATE: Approximately 1 month ago  SUBJECTIVE:                                                                                                                                                                                           SUBJECTIVE STATEMENT: Patient reports that she is really hurting today. She notes that it started while she was at work today. She reports that she fell last Thursday when her left leg gave out on her while she was going up stairs. She reports that she  scratched her leg, but was otherwise not injured.   PERTINENT HISTORY:  Scoliosis and current smoker  PAIN:  Are you having pain? Yes: NPRS scale: 11/10 Pain location: left low back Pain description: intermittent burning Aggravating factors: yard work, Pharmacologist, standing, left side lying, lifting, work Relieving factors: taking a hot shower  PRECAUTIONS: None  WEIGHT BEARING RESTRICTIONS: No  FALLS:  Has patient fallen in last 6 months? 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   PATIENT SURVEYS:  Modified Oswestry 44/100   SCREENING FOR RED FLAGS: Bowel or bladder incontinence: No Spinal tumors: No Cauda equina syndrome: No Compression fracture: No Abdominal aneurysm: No  COGNITION: Overall cognitive status: Within functional limits for tasks assessed     SENSATION: Patient reports no numbness or tingling  POSTURE: rounded shoulders, forward head, and flexed trunk  (stands with 12 degrees of lumbar flexion)   PALPATION: Familiar pain reproduced by palpation to her left lumbar paraspinals, QL, obliques, and latissimus dorsi  LUMBAR ROM:   AROM eval  Flexion 80; familiar pain  Extension 4; familiar pain  Right lateral flexion 14; familiar pain  Left lateral flexion 8; familiar pain  Right rotation 50% limited; "not so bad"   Left rotation 25% limited; "sore"    (Blank rows = not tested)  LOWER  EXTREMITY ROM: WFL for activities assessed  LOWER EXTREMITY MMT:    MMT Right eval Left eval  Hip flexion 3+/5; sore 4-/5; sore  Hip extension    Hip abduction    Hip adduction    Hip internal rotation    Hip external rotation    Knee flexion 4+/5; sore and painful  4+/5; sore and painful   Knee extension 3/5 3/5  Ankle dorsiflexion 4/5 4/5  Ankle plantarflexion    Ankle inversion    Ankle eversion     (Blank rows = not tested)  LUMBAR SPECIAL TESTS:  Limited due to pain severity and irritability  GAIT: Assistive device utilized: None Level of assistance: Complete Independence Comments: Flexed trunk with reduced gait speed and stride length  TODAY'S TREATMENT:                                                                                                                              DATE:                                     12/30/22 EXERCISE LOG  Exercise Repetitions and Resistance Comments  Nustep  L1 x 20 minutes   Upright stance at wall  1 minute  Limited by familiar pain   LAQ 15 reps each    Seated HS stretch  4 x 30 seconds each    Resisted rows  Red t-band x 25 reps  With cueing  for latissimus dorsi engagement  Marching on foam  30 reps each  BUE support  Side stepping  2 minutes  BUE support   Blank cell = exercise not performed today                                    12/23/22 EXERCISE LOG  Exercise Repetitions and Resistance Comments  Nustep  L2 x 20 minutes Reduced back pain  Seated march  13 reps each    Seated hip ADD isometric  2 minutes w/ 5 second hold   Seated hip ABD isometric  3 minutes w/ 5 second hold    Seated heel raise  20 reps    Seated toe raise  20 reps     Blank cell = exercise not performed today  Manual Therapy Soft Tissue Mobilization: left latissimus dorsi, for reduced pain and tone Joint Mobilizations: L1-2 CPA's , grade I-II for reduced pain                                      12/16/22 EXERCISE LOG  Exercise Repetitions and  Resistance Comments  Nustep  L2 x 15 minutes Reduced pain to a 6/10  Isometric ball press  3 minutes w/ 5 second hold   Slouch overcorrect  Aggravated cervical pain   LAQ    Seated hip ADD isometric    Seated march      Blank cell = exercise not performed today   Discussed walking program and benefits  Manual STW    to LT side QL and thoracolumbar paras with Pt sitting   PATIENT EDUCATION:  Education details: anatomy, healing, and activity modification  Person educated: Patient Education method: Explanation Education comprehension: verbalized understanding  HOME EXERCISE PROGRAM:   ASSESSMENT:  CLINICAL IMPRESSION: Treatment focused on familiar interventions for reduced low back pain. The nustep and seated hamstring stretching was the most effective at reducing her familiar symptoms. She required minimal cueing with resisted rows for proper exercise performance to facilitate latissimus dorsi engagement. She reported feeling a little better upon the conclusion of treatment. She continues to require skilled physical therapy to address her remaining impairments to maximize her functional mobility.   OBJECTIVE IMPAIRMENTS: Abnormal gait, decreased activity tolerance, decreased mobility, difficulty walking, decreased ROM, decreased strength, hypomobility, impaired tone, postural dysfunction, and pain.   ACTIVITY LIMITATIONS: carrying, lifting, bending, standing, sleeping, stairs, transfers, bed mobility, dressing, and locomotion level  PARTICIPATION LIMITATIONS: meal prep, cleaning, laundry, shopping, community activity, occupation, and yard work  PERSONAL FACTORS: Profession and 1-2 comorbidities: Scoliosis and current smoker  are also affecting patient's functional outcome.   REHAB POTENTIAL: Fair    CLINICAL DECISION MAKING: Evolving/moderate complexity  EVALUATION COMPLEXITY: Moderate   GOALS: Goals reviewed with patient? Yes  SHORT TERM GOALS: Target date:  12/09/22  Patient will be independent with her initial HEP. Baseline: Goal status: INITIAL  2.  Patient will be able to complete her critical job demands without her familiar pain exceeding 8/10. Baseline:  Goal status: INITIAL  3.  Patient will improve her ODI score to 34% or less for improved function with her daily activities. Baseline:  Goal status: INITIAL  LONG TERM GOALS: Target date: 12/30/22  Patient will be independent with her advanced HEP. Baseline:  Goal status: INITIAL  2.  Patient will be able  to complete her critical job demands without her familiar pain exceeding 6/10. Baseline:  Goal status: INITIAL  3.  Patient will be able to demonstrate at least 15 degrees of lumbar extension for improved function with overhead activities. Baseline:  Goal status: INITIAL  4.  Patient will be able to navigate at least 4 steps with a reciprocal pattern for improved household mobility. Baseline:  Goal status: INITIAL  5.  Patient will improve her ODI score to 24% or less for improved function with her daily activities. Baseline:  Goal status: INITIAL  PLAN:  PT FREQUENCY: 2x/week  PT DURATION: other: 5 weeks  PLANNED INTERVENTIONS: Therapeutic exercises, Therapeutic activity, Neuromuscular re-education, Balance training, Gait training, Patient/Family education, Self Care, Joint mobilization, Stair training, Spinal mobilization, Cryotherapy, Moist heat, Manual therapy, and Re-evaluation.  PLAN FOR NEXT SESSION: NuStep, lumbar stabilization, postural reeducation, lower extremity strengthening, and manual therapy as needed   Granville Lewis, PT 12/30/2022, 5:48 PM

## 2023-01-05 ENCOUNTER — Encounter: Payer: No Typology Code available for payment source | Admitting: Genetic Counselor

## 2023-01-05 ENCOUNTER — Other Ambulatory Visit: Payer: No Typology Code available for payment source

## 2023-01-06 ENCOUNTER — Ambulatory Visit: Payer: Medicaid Other

## 2023-01-06 DIAGNOSIS — M6281 Muscle weakness (generalized): Secondary | ICD-10-CM

## 2023-01-06 DIAGNOSIS — M5459 Other low back pain: Secondary | ICD-10-CM

## 2023-01-06 NOTE — Therapy (Signed)
OUTPATIENT PHYSICAL THERAPY THORACOLUMBAR TREATMENT   Patient Name: Carol Nguyen MRN: [id number] DOB:10/04/1975, 47 y.o., female Today's Date: 01/06/2023  END OF SESSION:  PT End of Session - 01/06/23 1632     Visit Number 6    Number of Visits 10    Date for PT Re-Evaluation 01/23/23    Authorization Type [employer]    Authorization Time Period 12/02/22-02/19/23    Authorization - Number of Visits 6    PT Start Time 1627    PT Stop Time 1717    PT Time Calculation (min) 50 min    Activity Tolerance Patient limited by pain    Behavior During Therapy [facility] for tasks assessed/performed               Past Medical History:  Diagnosis Date   Migraine    Psoriasis    Scoliosis    Past Surgical History:  Procedure Laterality Date   CESAREAN SECTION     2   SHOULDER SURGERY Left    TUBAL LIGATION     Patient Active Problem List   Diagnosis Date Noted   BMI 36.0-36.9,adult 11/05/2022   Family history of breast cancer in first degree relative 11/05/2022   Family history of ovarian cancer 11/05/2022   Thoracogenic scoliosis of thoracic region 11/05/2022   Family history of stomach cancer 11/05/2022   Dry skin dermatitis 08/11/2022   REFERRING PROVIDER: Eldred Manges, MD   REFERRING DIAG: Chronic left-sided low back pain, unspecified whether sciatica present   Rationale for Evaluation and Treatment: Rehabilitation  THERAPY DIAG:  Other low back pain  Muscle weakness (generalized)  ONSET DATE: Approximately 1 month ago  SUBJECTIVE:                                                                                                                                                                                           SUBJECTIVE STATEMENT: Patient reports that she is really hurting from work today. She feels that her back is not getting better as it is still hurting.   PERTINENT HISTORY:  Scoliosis and current smoker  PAIN:  Are you having pain? Yes: NPRS scale:  100/10 Pain location: left low back Pain description: intermittent burning Aggravating factors: yard work, Pharmacologist, standing, left side lying, lifting, work Relieving factors: taking a hot shower  PRECAUTIONS: None  WEIGHT BEARING RESTRICTIONS: No  FALLS:  Has patient fallen in last 6 months? 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   PATIENT SURVEYS:  Modified Oswestry 44/100   SCREENING FOR RED FLAGS: Bowel or bladder incontinence: No Spinal tumors: No Cauda equina syndrome: No Compression fracture: No Abdominal aneurysm: No  COGNITION: Overall cognitive status: Within functional limits for tasks assessed     SENSATION: Patient reports no numbness or tingling  POSTURE: rounded shoulders, forward head, and flexed trunk  (stands with 12 degrees of lumbar flexion)   PALPATION: Familiar pain reproduced by palpation to her left lumbar paraspinals, QL, obliques, and latissimus dorsi  LUMBAR ROM:   AROM eval 01/06/23; limited by familiar pain   Flexion 80; familiar pain 22  Extension 4; familiar pain 8  Right lateral flexion 14; familiar pain   Left lateral flexion 8; familiar pain   Right rotation 50% limited; "not so bad"    Left rotation 25% limited; "sore"     (Blank rows = not tested)  LOWER EXTREMITY ROM: WFL for activities assessed  LOWER EXTREMITY MMT:    MMT Right eval Left eval  Hip flexion  3+/5; sore 4-/5; sore  Hip extension    Hip abduction    Hip adduction    Hip internal rotation    Hip external rotation    Knee flexion 4+/5; sore and painful  4+/5; sore and painful   Knee extension 3/5 3/5  Ankle dorsiflexion 4/5 4/5  Ankle plantarflexion    Ankle inversion    Ankle eversion     (Blank rows = not tested)  LUMBAR SPECIAL TESTS:  Limited due to pain severity and irritability  GAIT: Assistive device utilized: None Level of assistance: Complete Independence Comments: Flexed trunk with reduced gait speed and stride length  TODAY'S TREATMENT:                                                                                                                              DATE:                                     01/06/23 EXERCISE LOG  Exercise Repetitions and Resistance Comments  Nustep  L2-4 x 17 minutes   LAQ 20 reps each     Blank cell = exercise not performed today                                    12/30/22 EXERCISE LOG  Exercise Repetitions and Resistance Comments  Nustep  L1 x 20 minutes   Upright stance at wall  1 minute  Limited by familiar pain   LAQ 15 reps each    Seated HS stretch  4 x 30 seconds each    Resisted rows  Red t-band x 25 reps  With cueing for latissimus dorsi engagement  Marching on foam  30 reps each  BUE support  Side stepping  2 minutes  BUE support   Blank cell = exercise not performed today                                    12/23/22 EXERCISE LOG  Exercise Repetitions and Resistance Comments  Nustep  L2 x 20 minutes Reduced back pain  Seated march  13 reps each    Seated hip ADD isometric  2 minutes w/ 5 second hold   Seated hip ABD isometric  3 minutes w/ 5 second hold    Seated heel raise  20 reps    Seated toe raise  20 reps     Blank cell = exercise not performed today  Manual Therapy Soft Tissue Mobilization: left latissimus dorsi, for reduced pain and tone Joint Mobilizations: L1-2 CPA's , grade I-II for reduced pain      PATIENT EDUCATION:  Education details: healing, anatomy, progress with therapy, and benefits of a walking program  Person educated: Patient Education method: Explanation Education comprehension: verbalized understanding  HOME EXERCISE PROGRAM:   ASSESSMENT:  CLINICAL IMPRESSION: Patient has made no significant progress with skilled physical therapy as evidenced by her subjective reports, objective measures, functional mobility, and progress toward her goals. She was able to meet her short term goal for her Oswestry Disability Index, but she was unable to meet her other short or long term goals with her familiar pain being her primary limiting factor. She was encouraged to begin a walking program for continued mobility. She reported understanding. She reported feeling comfortable being discharged at this time.   PHYSICAL THERAPY DISCHARGE SUMMARY  Visits from Start of Care: 6  Current functional level related to goals / functional outcomes: Patient was able to meet her short term ODI goal, but was unable to meet her other short or long term goals due to pain.    Remaining deficits: Pain, AROM, and decreased muscular strength   Education / Equipment: HEP    Patient agrees to discharge. Patient goals were partially met. Patient is being discharged due to maximized rehab potential.     OBJECTIVE IMPAIRMENTS: Abnormal gait, decreased activity tolerance, decreased mobility, difficulty walking, decreased ROM, decreased strength, hypomobility, impaired tone, postural dysfunction, and pain.   ACTIVITY LIMITATIONS: carrying, lifting, bending, standing, sleeping, stairs, transfers, bed mobility, dressing, and locomotion level  PARTICIPATION LIMITATIONS: meal prep, cleaning, laundry, shopping, community activity, occupation, and yard work  PERSONAL FACTORS: Profession and 1-2 comorbidities: Scoliosis and current smoker  are also affecting patient's functional outcome.   REHAB POTENTIAL:  Fair    CLINICAL DECISION MAKING: Evolving/moderate complexity  EVALUATION COMPLEXITY: Moderate   GOALS: Goals reviewed with patient? Yes  SHORT TERM GOALS: Target date: 12/09/22  Patient will be independent with her initial HEP. Baseline: "I have not started"  Goal status: NOT MET  2.  Patient will be able to complete her critical job demands without her familiar pain exceeding 8/10. Baseline:  Goal status: NOT MET  3.  Patient will improve her ODI score to 34% or  less for improved function with her daily activities. Baseline: 30% on 01/06/23 Goal status: MET  LONG TERM GOALS: Target date: 12/30/22  Patient will be independent with her advanced HEP. Baseline:  Goal status: NOT MET  2.  Patient will be able to complete her critical job demands without her familiar pain exceeding 6/10. Baseline:  Goal status: NOT MET  3.  Patient will be able to demonstrate at least 15 degrees of lumbar extension for improved function with overhead activities. Baseline:  Goal status: NOT MET  4.  Patient will be able to navigate at least 4 steps with a reciprocal pattern for improved household mobility. Baseline:  Goal status: NOT MET  5.  Patient will improve her ODI score to 24% or less for improved function with her daily activities. Baseline: 30% disability Goal status: NOT MET  PLAN:  PT FREQUENCY: 2x/week  PT DURATION: other: 5 weeks  PLANNED INTERVENTIONS: Therapeutic exercises, Therapeutic activity, Neuromuscular re-education, Balance training, Gait training, Patient/Family education, Self Care, Joint mobilization, Stair training, Spinal mobilization, Cryotherapy, Moist heat, Manual therapy, and Re-evaluation.  PLAN FOR NEXT SESSION: NuStep, lumbar stabilization, postural reeducation, lower extremity strengthening, and manual therapy as needed   Granville Lewis, PT 01/06/2023, 5:42 PM

## 2023-02-05 ENCOUNTER — Ambulatory Visit: Payer: Medicaid Other | Admitting: Family Medicine

## 2023-02-05 ENCOUNTER — Encounter: Payer: Self-pay | Admitting: Family Medicine

## 2023-02-05 ENCOUNTER — Ambulatory Visit: Payer: Medicaid Other | Admitting: Orthopaedic Surgery

## 2023-02-05 VITALS — BP 116/83 | HR 94 | Temp 97.6°F | Ht 63.0 in | Wt 194.6 lb

## 2023-02-05 VITALS — Ht 63.0 in | Wt 194.0 lb

## 2023-02-05 DIAGNOSIS — Z0000 Encounter for general adult medical examination without abnormal findings: Secondary | ICD-10-CM | POA: Diagnosis not present

## 2023-02-05 DIAGNOSIS — T148XXA Other injury of unspecified body region, initial encounter: Secondary | ICD-10-CM

## 2023-02-05 DIAGNOSIS — Z130 Encounter for screening for diseases of the blood and blood-forming organs and certain disorders involving the immune mechanism: Secondary | ICD-10-CM

## 2023-02-05 DIAGNOSIS — Z1329 Encounter for screening for other suspected endocrine disorder: Secondary | ICD-10-CM

## 2023-02-05 DIAGNOSIS — Z1211 Encounter for screening for malignant neoplasm of colon: Secondary | ICD-10-CM

## 2023-02-05 DIAGNOSIS — Z114 Encounter for screening for human immunodeficiency virus [HIV]: Secondary | ICD-10-CM | POA: Diagnosis not present

## 2023-02-05 DIAGNOSIS — Z0001 Encounter for general adult medical examination with abnormal findings: Secondary | ICD-10-CM | POA: Diagnosis not present

## 2023-02-05 DIAGNOSIS — M4134 Thoracogenic scoliosis, thoracic region: Secondary | ICD-10-CM

## 2023-02-05 DIAGNOSIS — J301 Allergic rhinitis due to pollen: Secondary | ICD-10-CM | POA: Diagnosis not present

## 2023-02-05 DIAGNOSIS — Z13220 Encounter for screening for lipoid disorders: Secondary | ICD-10-CM | POA: Diagnosis not present

## 2023-02-05 DIAGNOSIS — Z23 Encounter for immunization: Secondary | ICD-10-CM

## 2023-02-05 DIAGNOSIS — Z1159 Encounter for screening for other viral diseases: Secondary | ICD-10-CM

## 2023-02-05 DIAGNOSIS — Z1231 Encounter for screening mammogram for malignant neoplasm of breast: Secondary | ICD-10-CM

## 2023-02-05 MED ORDER — LEVOCETIRIZINE DIHYDROCHLORIDE 5 MG PO TABS
5.0000 mg | ORAL_TABLET | Freq: Every evening | ORAL | 1 refills | Status: DC
Start: 2023-02-05 — End: 2024-02-09

## 2023-02-05 NOTE — Progress Notes (Signed)
Complete physical exam  Patient: Carol Nguyen   DOB: 1976/03/31   47 y.o. Female  MRN: [id number]  Subjective:    Chief Complaint  Patient presents with   Annual Exam    Carol Nguyen is a 47 y.o. female who presents today for a complete physical exam. She reports consuming a general diet. The patient has a physically strenuous job, but has no regular exercise apart from work.  She generally feels well. She reports sleeping well. She does have additional problems to discuss today.    Most recent fall risk assessment:    02/05/2023   11:53 AM  Fall Risk   Falls in the past year? 0     Most recent depression screenings:    02/05/2023   11:53 AM 11/05/2022    3:31 PM  PHQ 2/9 Scores  PHQ - 2 Score 0 0  PHQ- 9 Score 0 0    Vision:Within last year and Dental: No current dental problems and Receives regular dental care  Patient Active Problem List   Diagnosis Date Noted   BMI 36.0-36.9,adult 11/05/2022   Family history of breast cancer in first degree relative 11/05/2022   Family history of ovarian cancer 11/05/2022   Thoracogenic scoliosis of thoracic region 11/05/2022   Family history of stomach cancer 11/05/2022   Dry skin dermatitis 08/11/2022   Past Medical History:  Diagnosis Date   Migraine    Psoriasis    Scoliosis    Past Surgical History:  Procedure Laterality Date   CESAREAN SECTION     2   SHOULDER SURGERY Left    TUBAL LIGATION     Social History   Tobacco Use   Smoking status: Every Day    Current packs/day: 0.50    Types: Cigarettes   Smokeless tobacco: Never  Vaping Use   Vaping status: Never Used  Substance Use Topics   Alcohol use: No   Drug use: No   Social History   Socioeconomic History   Marital status: Married    Spouse name: Marina Goodell   Number of children: 3   Years of education: Not on file   Highest education level: Not on file  Occupational History    Comment: airport  Tobacco Use   Smoking status: Every Day    Current  packs/day: 0.50    Types: Cigarettes   Smokeless tobacco: Never  Vaping Use   Vaping status: Never Used  Substance and Sexual Activity   Alcohol use: No   Drug use: No   Sexual activity: Yes    Birth control/protection: Surgical  Other Topics Concern   Not on file  Social History Narrative   Not on file   Social Determinants of Health   Financial Resource Strain: Not on file  Food Insecurity: Not on file  Transportation Needs: Not on file  Physical Activity: Not on file  Stress: Not on file  Social Connections: Not on file  Intimate Partner Violence: Not on file   Family Status  Relation Name Status   Mother  Deceased   Father  Deceased   Sister  Alive   Sister  Deceased   Brother  Other   Daughter  Alive   Daughter  Alive   Daughter  Alive   MGM  Deceased   MGF  Deceased   PGM  Alive   PGF  Deceased  No partnership data on file   Family History  Problem Relation Age of Onset  Cancer Mother    Breast cancer Mother    Diabetes Mother    Hypertension Mother    Stroke Father    Heart disease Father    Cancer Sister    Cancer Paternal Grandmother    Cancer Paternal Grandfather    Allergies  Allergen Reactions   Sulfa Antibiotics Other (See Comments)    Nose bleed, hives      Patient Care Team: Sonny Masters, FNP as PCP - General (Family Medicine)   Outpatient Medications Prior to Visit  Medication Sig   albuterol (VENTOLIN HFA) 108 (90 Base) MCG/ACT inhaler Inhale 2 puffs into the lungs.   No facility-administered medications prior to visit.    Review of Systems  Constitutional:  Negative for chills, diaphoresis, fever, malaise/fatigue and weight loss.  HENT:  Positive for congestion. Negative for ear discharge, ear pain, hearing loss, nosebleeds, sinus pain, sore throat and tinnitus.   Respiratory:  Positive for cough. Negative for hemoptysis, sputum production, shortness of breath, wheezing and stridor.   Musculoskeletal:  Positive for back  pain (chronic). Negative for falls, joint pain, myalgias and neck pain.  Psychiatric/Behavioral:  Negative for depression, hallucinations, memory loss, substance abuse and suicidal ideas. The patient is not nervous/anxious and does not have insomnia.   All other systems reviewed and are negative.       Objective:     BP 116/83   Pulse 94   Temp 97.6 F (36.4 C) (Temporal)   Ht 5\' 3"  (1.6 m)   Wt 194 lb 9.6 oz (88.3 kg)   LMP 01/29/2023   SpO2 97%   BMI 34.47 kg/m  BP Readings from Last 3 Encounters:  02/05/23 116/83  11/05/22 120/82  10/16/22 121/74   Wt Readings from Last 3 Encounters:  02/05/23 194 lb 9.6 oz (88.3 kg)  11/13/22 204 lb (92.5 kg)  11/05/22 204 lb 6.4 oz (92.7 kg)   SpO2 Readings from Last 3 Encounters:  02/05/23 97%  11/05/22 95%  10/16/22 99%      Physical Exam Vitals and nursing note reviewed.  Constitutional:      General: She is not in acute distress.    Appearance: Normal appearance. She is well-developed and well-groomed. She is obese. She is not ill-appearing, toxic-appearing or diaphoretic.  HENT:     Head: Normocephalic and atraumatic.     Jaw: There is normal jaw occlusion.     Right Ear: Hearing, tympanic membrane, ear canal and external ear normal.     Left Ear: Hearing, tympanic membrane, ear canal and external ear normal.     Nose: Rhinorrhea present. Rhinorrhea is clear.     Mouth/Throat:     Lips: Pink.     Mouth: Mucous membranes are moist.     Pharynx: Oropharynx is clear. Uvula midline.     Comments: Cobblestoning to posterior oropharynx  Eyes:     General: Lids are normal.     Extraocular Movements: Extraocular movements intact.     Conjunctiva/sclera: Conjunctivae normal.     Pupils: Pupils are equal, round, and reactive to light.  Neck:     Thyroid: No thyroid mass, thyromegaly or thyroid tenderness.     Vascular: No carotid bruit or JVD.     Trachea: Trachea and phonation normal.  Cardiovascular:     Rate and  Rhythm: Normal rate and regular rhythm.     Chest Wall: PMI is not displaced.     Pulses: Normal pulses.     Heart sounds:  Normal heart sounds. No murmur heard.    No friction rub. No gallop.  Pulmonary:     Effort: Pulmonary effort is normal. No respiratory distress.     Breath sounds: Normal breath sounds. No wheezing.  Abdominal:     General: Bowel sounds are normal. There is no distension or abdominal bruit.     Palpations: Abdomen is soft. There is no hepatomegaly or splenomegaly.     Tenderness: There is no abdominal tenderness. There is no right CVA tenderness or left CVA tenderness.     Hernia: No hernia is present.  Genitourinary:    Comments: Sees GYN Musculoskeletal:        General: Normal range of motion.     Cervical back: Normal range of motion and neck supple.     Right lower leg: No edema.     Left lower leg: No edema.  Lymphadenopathy:     Cervical: No cervical adenopathy.  Skin:    General: Skin is warm and dry.     Capillary Refill: Capillary refill takes less than 2 seconds.     Coloration: Skin is not cyanotic, jaundiced or pale.     Findings: No rash.       Neurological:     General: No focal deficit present.     Mental Status: She is alert and oriented to person, place, and time.     Sensory: Sensation is intact.     Motor: Motor function is intact.     Coordination: Coordination is intact.     Gait: Gait is intact.     Deep Tendon Reflexes: Reflexes are normal and symmetric.  Psychiatric:        Attention and Perception: Attention and perception normal.        Mood and Affect: Mood and affect normal.        Speech: Speech normal.        Behavior: Behavior normal. Behavior is cooperative.        Thought Content: Thought content normal.        Cognition and Memory: Cognition and memory normal.        Judgment: Judgment normal.       Last CBC Lab Results  Component Value Date   WBC 7.5 11/05/2022   HGB 14.1 11/05/2022   HCT 43.3 11/05/2022    MCV 93 11/05/2022   MCH 30.2 11/05/2022   RDW 12.4 11/05/2022   PLT 279 11/05/2022   Last metabolic panel Lab Results  Component Value Date   GLUCOSE 83 11/05/2022   NA 141 11/05/2022   K 4.1 11/05/2022   CL 104 11/05/2022   CO2 24 11/05/2022   BUN 9 11/05/2022   CREATININE 0.67 11/05/2022   EGFR 110 11/05/2022   CALCIUM 8.9 11/05/2022   PROT 5.9 (L) 11/05/2022   ALBUMIN 4.1 11/05/2022   LABGLOB 1.8 11/05/2022   AGRATIO 2.3 (H) 11/05/2022   BILITOT 0.3 11/05/2022   ALKPHOS 108 11/05/2022   AST 15 11/05/2022   ALT 18 11/05/2022   ANIONGAP 7 11/02/2019   Last lipids Lab Results  Component Value Date   CHOL 176 11/02/2019   HDL 43 11/02/2019   LDLCALC 122 (H) 11/02/2019   TRIG 57 11/02/2019   CHOLHDL 4.1 11/02/2019   Last hemoglobin A1c Lab Results  Component Value Date   HGBA1C 5.4 11/05/2022   Last thyroid functions Lab Results  Component Value Date   TSH 0.980 11/05/2022   T4TOTAL 7.0 11/05/2022  Assessment & Plan:    Routine Health Maintenance and Physical Exam  Immunization History  Administered Date(s) Administered   Influenza,inj,Quad PF,6+ Mos 04/22/2019, 05/05/2020   Influenza,trivalent, recombinat, inj, PF 08/11/2015   Influenza-Unspecified 04/22/2019, 05/05/2020, 07/03/2022   Moderna Sars-Covid-2 Vaccination 11/26/2019, 12/24/2019   Tdap 12/17/2007, 02/05/2023    Health Maintenance  Topic Date Due   PAP SMEAR-Modifier  Never done   Hepatitis C Screening  11/05/2023 (Originally 12/19/1995)   HIV Screening  11/05/2023 (Originally 12/18/1992)   Colonoscopy  02/05/2024 (Originally 12/19/2022)   INFLUENZA VACCINE  02/26/2023   DTaP/Tdap/Td (3 - Td or Tdap) 02/04/2033   HPV VACCINES  Aged Out   COVID-19 Vaccine  Discontinued    Discussed health benefits of physical activity, and encouraged her to engage in regular exercise appropriate for her age and condition.  Problem List Items Addressed This Visit   None Visit Diagnoses     Annual  physical exam    -  Primary   Relevant Orders   Anemia Profile B   CMP14+EGFR   Lipid panel   Thyroid Panel With TSH   Hepatitis C antibody   HIV Antibody (routine testing w rflx)   MM Digital Screening   Cologuard   Tdap vaccine greater than or equal to 7yo IM (Completed)   Encounter for screening mammogram for malignant neoplasm of breast       Relevant Orders   MM Digital Screening   Screening for HIV (human immunodeficiency virus)       Relevant Orders   HIV Antibody (routine testing w rflx)   Need for hepatitis C screening test       Relevant Orders   Hepatitis C antibody   Screening for lipid disorders       Relevant Orders   Lipid panel   Screening for deficiency anemia       Relevant Orders   Anemia Profile B   Screening for endocrine disorder       Relevant Orders   CMP14+EGFR   Thyroid Panel With TSH   Screening for colorectal cancer       Relevant Orders   Cologuard   Seasonal allergic rhinitis due to pollen       Relevant Medications   levocetirizine (XYZAL) 5 MG tablet   Need for Tdap vaccination       Relevant Orders   Tdap vaccine greater than or equal to 7yo IM (Completed)   Abrasion       Relevant Orders   Tdap vaccine greater than or equal to 7yo IM (Completed)      Return in about 1 year (around 02/05/2024) for CPE.    The above assessment and management plan was discussed with the patient. The patient verbalized understanding of and has agreed to the management plan. Patient is aware to call the clinic if they develop any new symptoms or if symptoms fail to improve or worsen. Patient is aware when to return to the clinic for a follow-up visit. Patient educated on when it is appropriate to go to the emergency department.    Kari Baars, FNP-C [facility] [address] [place], [place] [address] [phone]

## 2023-02-05 NOTE — Progress Notes (Signed)
Office Visit Note   Patient: Carol Nguyen           Date of Birth: [birth year]           MRN: [id number] Visit Date: 02/05/2023              Requested by: Sonny Masters, FNP [address] [place],  [place] [address] PCP: Sonny Masters, FNP   Assessment & Plan: Visit Diagnoses:  1. Thoracogenic scoliosis of thoracic region     Plan: Discussed with her that referral could be made to tertiary referral center for lumbar instrumented fusion.  We discussed problems with adjacent level degenerative changes in the levels on each hand that are not fused and potential for needing to extend the fusion at a later date.  We discussed facet arthropathy with lumbar curvature and stretching, weight loss, walking program.  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  Other options would be facet injection which would be temporary she understands.  She will continue to work on weight loss and stretching program and core strengthening to help support her back.  Follow-Up Instructions: No follow-ups on file.   Orders:  No orders of the defined types were placed in this encounter.  No orders of the defined types were placed in this encounter.     Procedures: No procedures performed   Clinical Data: No additional findings.   Subjective: Chief Complaint  Patient presents with   Other     Follow up thoracic pain/scoliosis    HPI 47 year old female with complaints of back pain.  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  Curve is stable she has been doing therapy and she states that therapy did not help may have made the pain worse.  She is use Tylenol and over-the-counter rubs as well as IcyHot.  She has tried lidocaine patches without relief .  No bowel bladder associated symptoms.  Patient has 3 daughters and she works as a Financial risk analyst.  BMI 34 previously 36.  Review of Systems 14 point system  update unchanged from 11/13/2022.   Objective: Vital Signs: Ht 5\' 3"  (1.6 m)   Wt 194 lb (88 kg)   LMP 01/29/2023   BMI 34.37 kg/m   Physical Exam Constitutional:      Appearance: She is well-developed.  HENT:     Head: Normocephalic.     Right Ear: External ear normal.     Left Ear: External ear normal. There is no impacted cerumen.  Eyes:     Pupils: Pupils are equal, round, and reactive to light.  Neck:     Thyroid: No thyromegaly.     Trachea: No tracheal deviation.  Cardiovascular:     Rate and Rhythm: Normal rate.  Pulmonary:     Effort: Pulmonary effort is normal.  Abdominal:     Palpations: Abdomen is soft.  Musculoskeletal:     Cervical back: No rigidity.  Skin:    General: Skin is warm and dry.  Neurological:     Mental Status: She is alert and oriented to person, place, and time.  Psychiatric:        Behavior: Behavior normal.     Ortho Exam right thoracic left lumbar curvature.  Scapular asymmetry.  Pelvis is level.  Normal lower extremity reflexes normal gait.  Specialty Comments:  No  specialty comments available.  Imaging: No results found.   PMFS History: Patient Active Problem List   Diagnosis Date Noted   BMI 36.0-36.9,adult 11/05/2022   Family history of breast cancer in first degree relative 11/05/2022   Family history of ovarian cancer 11/05/2022   Thoracogenic scoliosis of thoracic region 11/05/2022   Family history of stomach cancer 11/05/2022   Dry skin dermatitis 08/11/2022   Past Medical History:  Diagnosis Date   Migraine    Psoriasis    Scoliosis     Family History  Problem Relation Age of Onset   Cancer Mother    Breast cancer Mother    Diabetes Mother    Hypertension Mother    Stroke Father    Heart disease Father    Cancer Sister    Cancer Paternal Grandmother    Cancer Paternal Grandfather     Past Surgical History:  Procedure Laterality Date   CESAREAN SECTION     2   SHOULDER SURGERY Left    TUBAL  LIGATION     Social History   Occupational History    Comment: airport  Tobacco Use   Smoking status: Every Day    Current packs/day: 0.50    Types: Cigarettes   Smokeless tobacco: Never  Vaping Use   Vaping status: Never Used  Substance and Sexual Activity   Alcohol use: No   Drug use: No   Sexual activity: Yes    Birth control/protection: Surgical

## 2023-02-06 LAB — CMP14+EGFR
ALT: 26 IU/L (ref 0–32)
AST: 22 IU/L (ref 0–40)
Albumin: 4.5 g/dL (ref 3.9–4.9)
Alkaline Phosphatase: 129 IU/L — ABNORMAL HIGH (ref 44–121)
BUN/Creatinine Ratio: 15 (ref 9–23)
BUN: 12 mg/dL (ref 6–24)
Bilirubin Total: 0.5 mg/dL (ref 0.0–1.2)
CO2: 22 mmol/L (ref 20–29)
Calcium: 9.6 mg/dL (ref 8.7–10.2)
Chloride: 100 mmol/L (ref 96–106)
Creatinine, Ser: 0.79 mg/dL (ref 0.57–1.00)
Globulin, Total: 2.2 g/dL (ref 1.5–4.5)
Glucose: 98 mg/dL (ref 70–99)
Potassium: 4.4 mmol/L (ref 3.5–5.2)
Sodium: 139 mmol/L (ref 134–144)
Total Protein: 6.7 g/dL (ref 6.0–8.5)
eGFR: 94 mL/min/{1.73_m2} (ref >59)

## 2023-02-06 LAB — ANEMIA PROFILE B
Basophils Absolute: 0 10*3/uL (ref 0.0–0.2)
Basos: 0 %
EOS (ABSOLUTE): 0.1 10*3/uL (ref 0.0–0.4)
Eos: 1 %
Ferritin: 74 ng/mL (ref 15–150)
Folate: 8.2 ng/mL (ref >3.0)
Hematocrit: 50.7 % — ABNORMAL HIGH (ref 34.0–46.6)
Hemoglobin: 17 g/dL — ABNORMAL HIGH (ref 11.1–15.9)
Immature Grans (Abs): 0 10*3/uL (ref 0.0–0.1)
Immature Granulocytes: 0 %
Iron Saturation: 25 % (ref 15–55)
Iron: 80 ug/dL (ref 27–159)
Lymphocytes Absolute: 1.4 10*3/uL (ref 0.7–3.1)
Lymphs: 21 %
MCH: 30.9 pg (ref 26.6–33.0)
MCHC: 33.5 g/dL (ref 31.5–35.7)
MCV: 92 fL (ref 79–97)
Monocytes Absolute: 0.9 10*3/uL (ref 0.1–0.9)
Monocytes: 13 %
Neutrophils Absolute: 4.4 10*3/uL (ref 1.4–7.0)
Neutrophils: 65 %
Platelets: 201 10*3/uL (ref 150–450)
RBC: 5.51 x10E6/uL — ABNORMAL HIGH (ref 3.77–5.28)
RDW: 11.9 % (ref 11.7–15.4)
Retic Ct Pct: 1 % (ref 0.6–2.6)
Total Iron Binding Capacity: 322 ug/dL (ref 250–450)
UIBC: 242 ug/dL (ref 131–425)
Vitamin B-12: 236 pg/mL (ref 232–1245)
WBC: 6.7 10*3/uL (ref 3.4–10.8)

## 2023-02-06 LAB — LIPID PANEL
Chol/HDL Ratio: 3.9 ratio (ref 0.0–4.4)
Cholesterol, Total: 164 mg/dL (ref 100–199)
HDL: 42 mg/dL (ref >39)
LDL Chol Calc (NIH): 105 mg/dL — ABNORMAL HIGH (ref 0–99)
Triglycerides: 92 mg/dL (ref 0–149)
VLDL Cholesterol Cal: 17 mg/dL (ref 5–40)

## 2023-02-06 LAB — HEPATITIS C ANTIBODY: Hep C Virus Ab: NONREACTIVE

## 2023-02-06 LAB — HIV ANTIBODY (ROUTINE TESTING W REFLEX): HIV Screen 4th Generation wRfx: NONREACTIVE

## 2023-02-06 LAB — THYROID PANEL WITH TSH
Free Thyroxine Index: 1.7 (ref 1.2–4.9)
T3 Uptake Ratio: 23 % — ABNORMAL LOW (ref 24–39)
T4, Total: 7.6 ug/dL (ref 4.5–12.0)
TSH: 0.996 u[IU]/mL (ref 0.450–4.500)

## 2023-03-02 ENCOUNTER — Ambulatory Visit
Admission: RE | Admit: 2023-03-02 | Discharge: 2023-03-02 | Disposition: A | Discharge status: Home / Self Care | Payer: Medicaid Other | Source: Ambulatory Visit | Attending: Family Medicine | Admitting: Family Medicine

## 2023-03-02 DIAGNOSIS — Z1231 Encounter for screening mammogram for malignant neoplasm of breast: Secondary | ICD-10-CM | POA: Diagnosis not present

## 2023-03-02 DIAGNOSIS — Z0000 Encounter for general adult medical examination without abnormal findings: Secondary | ICD-10-CM

## 2023-05-19 ENCOUNTER — Ambulatory Visit (INDEPENDENT_AMBULATORY_CARE_PROVIDER_SITE_OTHER): Payer: Medicaid Other

## 2023-05-19 DIAGNOSIS — Z23 Encounter for immunization: Secondary | ICD-10-CM

## 2023-05-21 ENCOUNTER — Ambulatory Visit: Payer: Medicaid Other

## 2023-09-08 DIAGNOSIS — J101 Influenza due to other identified influenza virus with other respiratory manifestations: Secondary | ICD-10-CM | POA: Diagnosis not present

## 2023-09-08 DIAGNOSIS — Z20822 Contact with and (suspected) exposure to covid-19: Secondary | ICD-10-CM | POA: Diagnosis not present

## 2023-09-08 DIAGNOSIS — J40 Bronchitis, not specified as acute or chronic: Secondary | ICD-10-CM | POA: Diagnosis not present

## 2023-09-08 DIAGNOSIS — R059 Cough, unspecified: Secondary | ICD-10-CM | POA: Diagnosis not present

## 2023-11-19 DIAGNOSIS — R051 Acute cough: Secondary | ICD-10-CM | POA: Diagnosis not present

## 2023-11-19 DIAGNOSIS — Z20822 Contact with and (suspected) exposure to covid-19: Secondary | ICD-10-CM | POA: Diagnosis not present

## 2023-11-19 DIAGNOSIS — J40 Bronchitis, not specified as acute or chronic: Secondary | ICD-10-CM | POA: Diagnosis not present

## 2023-11-19 DIAGNOSIS — J069 Acute upper respiratory infection, unspecified: Secondary | ICD-10-CM | POA: Diagnosis not present

## 2023-12-24 ENCOUNTER — Ambulatory Visit: Admitting: Family Medicine

## 2024-02-09 ENCOUNTER — Encounter: Payer: Self-pay | Admitting: Family Medicine

## 2024-02-09 ENCOUNTER — Ambulatory Visit: Payer: Medicaid Other | Admitting: Family Medicine

## 2024-02-09 VITALS — BP 121/87 | HR 88 | Temp 97.0°F | Ht 62.5 in | Wt 201.4 lb

## 2024-02-09 DIAGNOSIS — Z1231 Encounter for screening mammogram for malignant neoplasm of breast: Secondary | ICD-10-CM

## 2024-02-09 DIAGNOSIS — J45909 Unspecified asthma, uncomplicated: Secondary | ICD-10-CM | POA: Insufficient documentation

## 2024-02-09 DIAGNOSIS — Z23 Encounter for immunization: Secondary | ICD-10-CM

## 2024-02-09 DIAGNOSIS — Z130 Encounter for screening for diseases of the blood and blood-forming organs and certain disorders involving the immune mechanism: Secondary | ICD-10-CM | POA: Diagnosis not present

## 2024-02-09 DIAGNOSIS — Z0001 Encounter for general adult medical examination with abnormal findings: Secondary | ICD-10-CM | POA: Diagnosis not present

## 2024-02-09 DIAGNOSIS — J301 Allergic rhinitis due to pollen: Secondary | ICD-10-CM | POA: Diagnosis not present

## 2024-02-09 DIAGNOSIS — Z1159 Encounter for screening for other viral diseases: Secondary | ICD-10-CM | POA: Diagnosis not present

## 2024-02-09 DIAGNOSIS — Z1211 Encounter for screening for malignant neoplasm of colon: Secondary | ICD-10-CM

## 2024-02-09 DIAGNOSIS — Z0000 Encounter for general adult medical examination without abnormal findings: Secondary | ICD-10-CM | POA: Diagnosis not present

## 2024-02-09 DIAGNOSIS — Z1329 Encounter for screening for other suspected endocrine disorder: Secondary | ICD-10-CM | POA: Diagnosis not present

## 2024-02-09 DIAGNOSIS — Z1321 Encounter for screening for nutritional disorder: Secondary | ICD-10-CM | POA: Diagnosis not present

## 2024-02-09 DIAGNOSIS — Z9851 Tubal ligation status: Secondary | ICD-10-CM | POA: Insufficient documentation

## 2024-02-09 DIAGNOSIS — R748 Abnormal levels of other serum enzymes: Secondary | ICD-10-CM

## 2024-02-09 DIAGNOSIS — Z13228 Encounter for screening for other metabolic disorders: Secondary | ICD-10-CM | POA: Diagnosis not present

## 2024-02-09 DIAGNOSIS — Z136 Encounter for screening for cardiovascular disorders: Secondary | ICD-10-CM

## 2024-02-09 MED ORDER — LEVOCETIRIZINE DIHYDROCHLORIDE 5 MG PO TABS: 5.0000 mg | ORAL_TABLET | Freq: Every evening | ORAL | 1 refills | Status: AC

## 2024-02-09 NOTE — Progress Notes (Signed)
 Complete physical exam  Patient: Carol Nguyen   DOB: 1975/09/15   48 y.o. Female  MRN: [id number]  Subjective:    Chief Complaint  Patient presents with   Annual Exam    Carol Nguyen is a 48 y.o. female who presents today for a complete physical exam. She reports consuming a general diet. Home exercise routine includes water exercises. She generally feels well. She reports sleeping well. She does not have additional problems to discuss today.   The patient is a 48 year old who presents for an annual physical exam.  Her last Pap smear was conducted within the last two to three years, but records have not been received from the health department. She reports no abnormal bleeding or discharge, and her menstrual periods are normal. She underwent tubal ligation at age 28 after her third child, performed during a C-section.  She has a history of asthma and uses albuterol  infrequently, as she tries to avoid triggers. She takes Zyrtec regularly for allergies, which she finds effective. She has not yet completed the Cologuard test for colorectal cancer screening, which was ordered last year. There is no family history of colorectal cancer, but her mother had breast cancer, and her sister had ovarian cancer.  She received a normal mammogram on a mobile unit last year and plans to do the same this year. She has a history of smoking but has decreased her usage significantly. She uses Restasis twice daily for her eyes without issues. A month ago, she had a piece of plastic removed from her eye, which healed well despite some bleeding.  She works seven days a week and reports difficulty with sleep due to her grandchild wanting to play at night. She tries to go to bed by 9:30 PM. She engages in physical activity by walking her dog daily and doing water exercises at the pool on weekends. She reports no changes in bowel habits, no blood in stool, no weight changes, night sweats, or loss of appetite.        Most recent fall risk assessment:    02/09/2024    3:47 PM  Fall Risk   Falls in the past year? 0  Number falls in past yr: 0  Injury with Fall? 0  Risk for fall due to : No Fall Risks  Follow up Falls evaluation completed     Most recent depression screenings:    02/09/2024    3:48 PM 02/05/2023   11:53 AM  PHQ 2/9 Scores  PHQ - 2 Score 0 0  PHQ- 9 Score 0 0    Vision:Within last year and Dental: No current dental problems and Receives regular dental care  Patient Active Problem List   Diagnosis Date Noted   History of bilateral tubal ligation 02/09/2024   Asthma 02/09/2024   BMI 36.0-36.9,adult 11/05/2022   Family history of breast cancer in first degree relative 11/05/2022   Family history of ovarian cancer 11/05/2022   Thoracogenic scoliosis of thoracic region 11/05/2022   Family history of stomach cancer 11/05/2022   Dry skin dermatitis 08/11/2022   Past Medical History:  Diagnosis Date   Migraine    Psoriasis    Scoliosis    Past Surgical History:  Procedure Laterality Date   BREAST BIOPSY Left 03/13/2020   PASH   CESAREAN SECTION     2   SHOULDER SURGERY Left    TUBAL LIGATION     Social History   Tobacco Use   Smoking status:  Every Day    Current packs/day: 0.50    Types: Cigarettes   Smokeless tobacco: Never  Vaping Use   Vaping status: Never Used  Substance Use Topics   Alcohol use: No   Drug use: No   Social History   Socioeconomic History   Marital status: Married    Spouse name: Carol Nguyen   Number of children: 3   Years of education: Not on file   Highest education level: Not on file  Occupational History    Comment: airport  Tobacco Use   Smoking status: Every Day    Current packs/day: 0.50    Types: Cigarettes   Smokeless tobacco: Never  Vaping Use   Vaping status: Never Used  Substance and Sexual Activity   Alcohol use: No   Drug use: No   Sexual activity: Yes    Birth control/protection: Surgical  Other Topics  Concern   Not on file  Social History Narrative   Not on file   Social Drivers of Health   Financial Resource Strain: Not on file  Food Insecurity: Not on file  Transportation Needs: Not on file  Physical Activity: Not on file  Stress: Not on file  Social Connections: Not on file  Intimate Partner Violence: Not on file   Family Status  Relation Name Status   Mother  Deceased   Father  Deceased   Sister  Alive   Sister  Deceased   Brother  Other   Daughter  Alive   Daughter  Alive   Daughter  Alive   MGM  Deceased   MGF  Deceased   PGM  Alive   PGF  Deceased  No partnership data on file   Family History  Problem Relation Age of Onset   Cancer Mother    Breast cancer Mother    Diabetes Mother    Hypertension Mother    Stroke Father    Heart disease Father    Cancer Sister    Cancer Paternal Grandmother    Cancer Paternal Grandfather    Allergies  Allergen Reactions   Sulfa Antibiotics Other (See Comments)    Nose bleed, hives      Patient Care Team: Carol Rock HERO, FNP as PCP - General (Family Medicine)   Outpatient Medications Prior to Visit  Medication Sig   albuterol  (VENTOLIN  HFA) 108 (90 Base) MCG/ACT inhaler Inhale 2 puffs into the lungs.   cycloSPORINE (RESTASIS) 0.05 % ophthalmic emulsion 1 drop 2 (two) times daily.   [DISCONTINUED] levocetirizine (XYZAL ) 5 MG tablet Take 1 tablet (5 mg total) by mouth every evening.   No facility-administered medications prior to visit.    ROS per HPI     Objective:     BP 121/87   Pulse 88   Temp (!) 97 F (36.1 C)   Ht 5' 2.5 (1.588 m)   Wt 201 lb 6.4 oz (91.4 kg)   SpO2 95%   BMI 36.25 kg/m  BP Readings from Last 3 Encounters:  02/09/24 121/87  02/05/23 116/83  11/05/22 120/82   Wt Readings from Last 3 Encounters:  02/09/24 201 lb 6.4 oz (91.4 kg)  02/05/23 194 lb (88 kg)  02/05/23 194 lb 9.6 oz (88.3 kg)   SpO2 Readings from Last 3 Encounters:  02/09/24 95%  02/05/23 97%  11/05/22  95%      Physical Exam Vitals and nursing note reviewed.  Constitutional:      General: She is not in acute distress.  Appearance: Normal appearance. She is well-developed and well-groomed. She is obese. She is not ill-appearing, toxic-appearing or diaphoretic.  HENT:     Head: Normocephalic and atraumatic.     Jaw: There is normal jaw occlusion.     Right Ear: Hearing, tympanic membrane, ear canal and external ear normal.     Left Ear: Hearing, tympanic membrane, ear canal and external ear normal.     Nose: Nose normal.     Mouth/Throat:     Lips: Pink.     Mouth: Mucous membranes are moist.     Pharynx: Oropharynx is clear. Uvula midline.  Eyes:     General: Lids are normal.     Extraocular Movements: Extraocular movements intact.     Conjunctiva/sclera: Conjunctivae normal.     Pupils: Pupils are equal, round, and reactive to light.  Neck:     Thyroid : No thyroid  mass, thyromegaly or thyroid  tenderness.     Vascular: No carotid bruit or JVD.     Trachea: Trachea and phonation normal.  Cardiovascular:     Rate and Rhythm: Normal rate and regular rhythm.     Chest Wall: PMI is not displaced.     Pulses: Normal pulses.     Heart sounds: Normal heart sounds. No murmur heard.    No friction rub. No gallop.  Pulmonary:     Effort: Pulmonary effort is normal. No respiratory distress.     Breath sounds: Normal breath sounds. No wheezing.  Abdominal:     General: Bowel sounds are normal. There is no distension or abdominal bruit.     Palpations: Abdomen is soft. There is no hepatomegaly or splenomegaly.     Tenderness: There is no abdominal tenderness. There is no right CVA tenderness or left CVA tenderness.     Hernia: No hernia is present.  Musculoskeletal:        General: Normal range of motion.     Cervical back: Normal range of motion and neck supple.     Right lower leg: No edema.     Left lower leg: No edema.  Lymphadenopathy:     Cervical: No cervical adenopathy.   Skin:    General: Skin is warm and dry.     Capillary Refill: Capillary refill takes less than 2 seconds.     Coloration: Skin is not cyanotic, jaundiced or pale.     Findings: No rash.  Neurological:     General: No focal deficit present.     Mental Status: She is alert and oriented to person, place, and time.     Sensory: Sensation is intact.     Motor: Motor function is intact.     Coordination: Coordination is intact.     Gait: Gait is intact.     Deep Tendon Reflexes: Reflexes are normal and symmetric.  Psychiatric:        Attention and Perception: Attention and perception normal.        Mood and Affect: Mood and affect normal.        Speech: Speech normal.        Behavior: Behavior normal. Behavior is cooperative.        Thought Content: Thought content normal.        Cognition and Memory: Cognition and memory normal.        Judgment: Judgment normal.       Last CBC Lab Results  Component Value Date   WBC 6.7 02/05/2023   HGB 17.0 (H) 02/05/2023  HCT 50.7 (H) 02/05/2023   MCV 92 02/05/2023   MCH 30.9 02/05/2023   RDW 11.9 02/05/2023   PLT 201 02/05/2023   Last metabolic panel Lab Results  Component Value Date   GLUCOSE 98 02/05/2023   NA 139 02/05/2023   K 4.4 02/05/2023   CL 100 02/05/2023   CO2 22 02/05/2023   BUN 12 02/05/2023   CREATININE 0.79 02/05/2023   EGFR 94 02/05/2023   CALCIUM 9.6 02/05/2023   PROT 6.7 02/05/2023   ALBUMIN 4.5 02/05/2023   LABGLOB 2.2 02/05/2023   AGRATIO 2.3 (H) 11/05/2022   BILITOT 0.5 02/05/2023   ALKPHOS 129 (H) 02/05/2023   AST 22 02/05/2023   ALT 26 02/05/2023   ANIONGAP 7 11/02/2019   Last lipids Lab Results  Component Value Date   CHOL 164 02/05/2023   HDL 42 02/05/2023   LDLCALC 105 (H) 02/05/2023   TRIG 92 02/05/2023   CHOLHDL 3.9 02/05/2023   Last hemoglobin A1c Lab Results  Component Value Date   HGBA1C 5.4 11/05/2022   Last thyroid  functions Lab Results  Component Value Date   TSH 0.996  02/05/2023   T4TOTAL 7.6 02/05/2023   Last vitamin B12 and Folate Lab Results  Component Value Date   VITAMINB12 236 02/05/2023   FOLATE 8.2 02/05/2023        Assessment & Plan:    Routine Health Maintenance and Physical Exam  Immunization History  Administered Date(s) Administered   Influenza, Seasonal, Injecte, Preservative Fre 05/19/2023   Influenza,inj,Quad PF,6+ Mos 04/22/2019, 05/05/2020   Influenza,trivalent, recombinat, inj, PF 08/11/2015   Influenza-Unspecified 04/22/2019, 05/05/2020, 07/03/2022   Moderna Sars-Covid-2 Vaccination 11/26/2019, 12/24/2019   PNEUMOCOCCAL CONJUGATE-20 02/09/2024   Tdap 12/17/2007, 02/05/2023    Health Maintenance  Topic Date Due   Cervical Cancer Screening (HPV/Pap Cotest)  06/15/2010   Fecal DNA (Cologuard)  Never done   Hepatitis B Vaccines (1 of 3 - 19+ 3-dose series) 02/08/2025 (Originally 12/18/1996)   INFLUENZA VACCINE  02/26/2024   DTaP/Tdap/Td (3 - Td or Tdap) 02/04/2033   Pneumococcal Vaccine 77-26 Years old  Completed   Hepatitis C Screening  Completed   HIV Screening  Completed   HPV VACCINES  Aged Out   Meningococcal B Vaccine  Aged Out   COVID-19 Vaccine  Discontinued      Asthma Asthma is well-controlled with infrequent use of albuterol . Regular use of Zyrtec helps prevent exacerbations. - Continue albuterol  as needed - Continue Zyrtec regularly  Smoking She reports a significant reduction in smoking. Smoking cessation is crucial for overall health and to reduce the risk of exacerbating lung disease.  General Health Maintenance She requires several screenings and vaccinations. Family history of breast and ovarian cancer necessitates regular screenings. Early detection is vital for improving treatment outcomes for colorectal and breast cancer. Cologuard is recommended for colorectal cancer screening; if positive, a colonoscopy is advised. If negative, repeat every three years. Mammogram is due next month. Pneumonia  vaccine is recommended due to lung disease. Hepatitis B immunity will be checked with a titer. - Request Pap smear records from the health department - Order Cologuard kit for colorectal cancer screening - Order mammogram for next month - Administer pneumonia vaccine - Draw titer to check immunity to Hepatitis B        Discussed health benefits of physical activity, and encouraged her to engage in regular exercise appropriate for her age and condition.  Problem List Items Addressed This Visit   None Visit Diagnoses  Annual physical exam    -  Primary   Relevant Orders   CBC with Differential/Platelet   Lipid panel   CMP14+EGFR   MM 3D SCREENING MAMMOGRAM BILATERAL BREAST     Encounter for screening mammogram for malignant neoplasm of breast       Relevant Orders   MM 3D SCREENING MAMMOGRAM BILATERAL BREAST     Encounter for screening for cardiovascular disorders       Relevant Orders   Lipid panel   CMP14+EGFR     Screening for endocrine, nutritional, metabolic and immunity disorder       Relevant Orders   CBC with Differential/Platelet   Lipid panel   Thyroid  Panel With TSH   CMP14+EGFR     Screening for colorectal cancer       Relevant Orders   Cologuard     Need for hepatitis B screening test       Relevant Orders   Hepatitis B surface antibody,qualitative     Seasonal allergic rhinitis due to pollen       Relevant Medications   levocetirizine (XYZAL ) 5 MG tablet     Elevated alkaline phosphatase level       Relevant Orders   Alkaline phosphatase, isoenzymes   Gamma GT   Nucleotidase, 5', blood     Immunization due       Relevant Orders   Pneumococcal conjugate vaccine 20-valent (Prevnar 20) (Completed)      Return in about 1 year (around 02/08/2025) for Annual Physical.     Rosaline Bruns, FNP

## 2024-02-10 ENCOUNTER — Telehealth: Payer: Self-pay

## 2024-02-10 ENCOUNTER — Ambulatory Visit: Payer: Self-pay | Admitting: Family Medicine

## 2024-02-10 NOTE — Telephone Encounter (Signed)
 Noted

## 2024-02-10 NOTE — Telephone Encounter (Signed)
 Copied from CRM [id number]. Topic: Clinical - Lab/Test Results >> Feb 10, 2024  2:05 PM Selinda RAMAN wrote: Reason for CRM: The patient called in to obtain her lab results. I relayed what the provider stated and she wants to think about it and call back if she wants to schedule the injection for Hep B.

## 2024-02-12 LAB — CBC WITH DIFFERENTIAL/PLATELET
Basophils Absolute: 0 x10E3/uL (ref 0.0–0.2)
Basos: 0 %
EOS (ABSOLUTE): 0.1 x10E3/uL (ref 0.0–0.4)
Eos: 1 %
Hematocrit: 44.3 % (ref 34.0–46.6)
Hemoglobin: 14.8 g/dL (ref 11.1–15.9)
Immature Grans (Abs): 0 x10E3/uL (ref 0.0–0.1)
Immature Granulocytes: 0 %
Lymphocytes Absolute: 2.3 x10E3/uL (ref 0.7–3.1)
Lymphs: 28 %
MCH: 30.8 pg (ref 26.6–33.0)
MCHC: 33.4 g/dL (ref 31.5–35.7)
MCV: 92 fL (ref 79–97)
Monocytes Absolute: 0.5 x10E3/uL (ref 0.1–0.9)
Monocytes: 7 %
Neutrophils Absolute: 5.2 x10E3/uL (ref 1.4–7.0)
Neutrophils: 64 %
Platelets: 221 x10E3/uL (ref 150–450)
RBC: 4.8 x10E6/uL (ref 3.77–5.28)
RDW: 11.9 % (ref 11.7–15.4)
WBC: 8.2 x10E3/uL (ref 3.4–10.8)

## 2024-02-12 LAB — CMP14+EGFR
ALT: 28 IU/L (ref 0–32)
AST: 23 IU/L (ref 0–40)
Albumin: 4.2 g/dL (ref 3.9–4.9)
Alkaline Phosphatase: 115 IU/L (ref 44–121)
BUN/Creatinine Ratio: 16 (ref 9–23)
BUN: 10 mg/dL (ref 6–24)
Bilirubin Total: 0.4 mg/dL (ref 0.0–1.2)
CO2: 20 mmol/L (ref 20–29)
Calcium: 9 mg/dL (ref 8.7–10.2)
Chloride: 103 mmol/L (ref 96–106)
Creatinine, Ser: 0.64 mg/dL (ref 0.57–1.00)
Globulin, Total: 1.8 g/dL (ref 1.5–4.5)
Glucose: 91 mg/dL (ref 70–99)
Potassium: 3.8 mmol/L (ref 3.5–5.2)
Sodium: 138 mmol/L (ref 134–144)
Total Protein: 6 g/dL (ref 6.0–8.5)
eGFR: 110 mL/min/1.73 (ref >59)

## 2024-02-12 LAB — LIPID PANEL
Chol/HDL Ratio: 3.9 ratio (ref 0.0–4.4)
Cholesterol, Total: 171 mg/dL (ref 100–199)
HDL: 44 mg/dL (ref >39)
LDL Chol Calc (NIH): 115 mg/dL — ABNORMAL HIGH (ref 0–99)
Triglycerides: 63 mg/dL (ref 0–149)
VLDL Cholesterol Cal: 12 mg/dL (ref 5–40)

## 2024-02-12 LAB — ALKALINE PHOSPHATASE, ISOENZYMES
BONE FRACTION: 44 % (ref 14–68)
INTESTINAL FRAC.: 7 % (ref 0–18)
LIVER FRACTION: 49 % (ref 18–85)

## 2024-02-12 LAB — THYROID PANEL WITH TSH
Free Thyroxine Index: 1.5 (ref 1.2–4.9)
T3 Uptake Ratio: 22 % — ABNORMAL LOW (ref 24–39)
T4, Total: 6.8 ug/dL (ref 4.5–12.0)
TSH: 0.748 u[IU]/mL (ref 0.450–4.500)

## 2024-02-12 LAB — NUCLEOTIDASE, 5', BLOOD: 5-Nucleotidase: 3 IU/L (ref 0–10)

## 2024-02-12 LAB — HEPATITIS B SURFACE ANTIBODY,QUALITATIVE: Hep B Surface Ab, Qual: NONREACTIVE

## 2024-02-12 LAB — GAMMA GT: GGT: 3 IU/L (ref 0–60)

## 2024-03-07 ENCOUNTER — Ambulatory Visit
Admission: RE | Admit: 2024-03-07 | Discharge: 2024-03-07 | Disposition: A | Discharge status: Home / Self Care | Source: Ambulatory Visit | Attending: Family Medicine

## 2024-03-07 DIAGNOSIS — Z1231 Encounter for screening mammogram for malignant neoplasm of breast: Secondary | ICD-10-CM | POA: Diagnosis not present

## 2024-04-13 ENCOUNTER — Encounter: Payer: Self-pay | Admitting: Family Medicine

## 2024-04-13 ENCOUNTER — Ambulatory Visit: Admitting: Family Medicine

## 2024-04-13 ENCOUNTER — Ambulatory Visit: Payer: Self-pay

## 2024-04-13 VITALS — BP 142/90 | HR 104 | Ht 62.5 in | Wt 201.0 lb

## 2024-04-13 DIAGNOSIS — J069 Acute upper respiratory infection, unspecified: Secondary | ICD-10-CM

## 2024-04-13 LAB — RAPID STREP SCREEN (MED CTR MEBANE ONLY): Strep Gp A Ag, IA W/Reflex: NEGATIVE

## 2024-04-13 LAB — VERITOR FLU A/B WAIVED
Influenza A: NEGATIVE
Influenza B: NEGATIVE

## 2024-04-13 LAB — CULTURE, GROUP A STREP

## 2024-04-13 MED ORDER — AMOXICILLIN 500 MG PO CAPS: 500.0000 mg | ORAL_CAPSULE | Freq: Two times a day (BID) | ORAL | 0 refills | Status: AC

## 2024-04-13 NOTE — Telephone Encounter (Signed)
 FYI Only or Action Required?: Action required by provider: request for appointment.  Patient was last seen in primary care on 02/09/2024 by Carol Nguyen HERO, FNP.  Called Nurse Triage reporting Sore Throat and Headache. OTC medications:  tylenol   Symptoms began several days ago.  Interventions attempted: SABRA  Symptoms are: unchanged.  Triage Disposition: See Physician Within 24 Hours  Patient/caregiver understands and will follow disposition?: YesCopied from CRM #[id number]. Topic: Clinical - Red Word Triage >> Apr 13, 2024 10:52 AM Carol Nguyen wrote: [facility] that prompted transfer to Nurse Triage: Patients husband  Oletta gross) is on phone patient feels she has laryngitis ,she can't talk , bad headache pain. Reason for Disposition  [1] MODERATE headache (e.g., interferes with normal activities) AND [2] present > 24 hours AND [3] unexplained  (Exceptions: Pain medicines not tried, typical migraine, or headache part of viral illness.)  Answer Assessment - Initial Assessment Questions Husband called on behalf of pt since she has lost her voice. He didn't know much about symptoms. She has a headache and can't talk. Our family has had a cold and figured she caught this.      1. LOCATION: Where does it hurt?      Back of head 2. ONSET: When did the headache start? (e.g., minutes, hours, days)      2-3 days 3. PATTERN: Does the pain come and go, or has it been constant since it started?     Comes and goes 4. SEVERITY: How bad is the pain? and What does it keep you from doing?  (e.g., Scale 1-10; mild, moderate, or severe)     na 5. RECURRENT SYMPTOM: Have you ever had headaches before? If Yes, ask: When was the last time? and What happened that time?      na 6. CAUSE: What do you think is causing the headache?     unknown 7. MIGRAINE: Have you been diagnosed with migraine headaches? If Yes, ask: Is this headache similar?      Not sure 8. HEAD INJURY: Has there  been any recent injury to your head?      denies 9. OTHER SYMPTOMS: Do you have any other symptoms? (e.g., fever, stiff neck, eye pain, sore throat, cold symptoms)     Lost voice  Protocols used: Headache-A-AH

## 2024-04-13 NOTE — Progress Notes (Signed)
 BP (!) 142/90   Pulse (!) 104   Ht 5' 2.5 (1.588 m)   Wt 201 lb (91.2 kg)   SpO2 96%   BMI 36.18 kg/m    Subjective:   Patient ID: Carolanne JONELLE Lodge, female    DOB: 1975-09-30, 48 y.o.   MRN: [id number]  HPI: ADANYA SOSINSKI is a 48 y.o. female presenting on 04/13/2024 for No chief complaint on file.   Discussed the use of AI scribe software for clinical note transcription with the patient, who gave verbal consent to proceed.  History of Present Illness   DALLIS DARDEN is a 48 year old female with asthma who presents with sore throat and voice loss.  She has been experiencing a sore throat and loss of voice for over a week. Symptoms began with congestion, followed by expectoration of phlegm. No fevers, chills, or body aches have been noted, and she has not experienced headaches. Swallowing is not painful, but she describes a sensation of throat swelling.  She has been using Flonase , cough drops, throat spray, and DayQuil with minimal relief. She has not needed to use her albuterol  inhaler and has not experienced wheezing during this illness.  She mentions that someone around her has been sick with similar symptoms, but she is unsure if she was tested for any specific illness.      Relevant past medical, surgical, family and social history reviewed and updated as indicated. Interim medical history since our last visit reviewed. Allergies and medications reviewed and updated.  Review of Systems  Constitutional:  Negative for chills and fever.  HENT:  Negative for ear discharge and ear pain.   Eyes:  Negative for redness and visual disturbance.  Respiratory:  Negative for chest tightness and shortness of breath.   Cardiovascular:  Negative for chest pain and leg swelling.  Musculoskeletal:  Negative for back pain and gait problem.  Skin:  Negative for rash.  Neurological:  Negative for light-headedness and headaches.  Psychiatric/Behavioral:  Negative for agitation and behavioral  problems.   All other systems reviewed and are negative.   Per HPI unless specifically indicated above   Allergies as of 04/13/2024       Reactions   Sulfa Antibiotics Other (See Comments)   Nose bleed, hives        Medication List        Accurate as of April 13, 2024  2:58 PM. If you have any questions, ask your nurse or doctor.          albuterol  108 (90 Base) MCG/ACT inhaler Commonly known as: VENTOLIN  HFA Inhale 2 puffs into the lungs.   amoxicillin  500 MG capsule Commonly known as: AMOXIL  Take 1 capsule (500 mg total) by mouth 2 (two) times daily. Started by: [facility] Jovan Schickling   cycloSPORINE 0.05 % ophthalmic emulsion Commonly known as: RESTASIS 1 drop 2 (two) times daily.   levocetirizine 5 MG tablet Commonly known as: XYZAL  Take 1 tablet (5 mg total) by mouth every evening.         Objective:   BP (!) 142/90   Pulse (!) 104   Ht 5' 2.5 (1.588 m)   Wt 201 lb (91.2 kg)   SpO2 96%   BMI 36.18 kg/m   Wt Readings from Last 3 Encounters:  04/13/24 201 lb (91.2 kg)  02/09/24 201 lb 6.4 oz (91.4 kg)  02/05/23 194 lb (88 kg)    Physical Exam Physical Exam   HEENT: Throat erythematous,  no exudate. CHEST: Lungs clear to auscultation bilaterally. CARDIOVASCULAR: Heart regular rate and rhythm, no murmurs.         Assessment & Plan:   Problem List Items Addressed This Visit   None Visit Diagnoses       Upper respiratory tract infection, unspecified type    -  Primary   Relevant Medications   amoxicillin  (AMOXIL ) 500 MG capsule   Other Relevant Orders   Veritor Flu A/B Waived   Rapid Strep Screen ([facility] ONLY)          Acute upper respiratory infection with sore throat and hoarseness Symptoms include sore throat, hoarseness, and congestion for over a week. Throat redness present without pus. Negative for wheezing or asthma exacerbation. Awaiting flu and strep test results. - Await flu and strep test results.  - Rapid flu  and rapid strep are both negative - Recommend salt water gargles and continue with Flonase  and lozenges and also gave her amoxicillin  that she can use if it does not prove.  Asthma No recent wheezing or asthma exacerbation. No need for albuterol  inhaler use reported. - Use albuterol  inhaler as needed.      Follow up plan: Return if symptoms worsen or fail to improve.  Counseling provided for all of the vaccine components Orders Placed This Encounter  Procedures   Rapid Strep Screen ([facility] ONLY)   Veritor Flu A/B Waived    Fonda Levins, MD [facility] 04/13/2024, 2:58 PM

## 2024-04-13 NOTE — Telephone Encounter (Signed)
 Apt scheduled.

## 2024-06-02 ENCOUNTER — Ambulatory Visit

## 2024-06-02 DIAGNOSIS — Z23 Encounter for immunization: Secondary | ICD-10-CM | POA: Diagnosis not present

## 2024-08-09 ENCOUNTER — Encounter: Payer: Self-pay | Admitting: *Deleted

## 2025-02-09 ENCOUNTER — Encounter: Payer: Self-pay | Admitting: Family Medicine

## 2025-02-15 ENCOUNTER — Encounter: Admitting: Family Medicine
